# Patient Record
Sex: Male | Born: 1960 | Race: White | Hispanic: No | Smoking: Current every day smoker
Health system: Southern US, Community
[De-identification: ages and names within clinical notes are randomized; demographics above are authoritative.]

## PROBLEM LIST (undated history)

## (undated) DIAGNOSIS — T7840XA Allergy, unspecified, initial encounter: Secondary | ICD-10-CM

## (undated) DIAGNOSIS — F329 Major depressive disorder, single episode, unspecified: Secondary | ICD-10-CM

## (undated) DIAGNOSIS — F419 Anxiety disorder, unspecified: Secondary | ICD-10-CM

## (undated) DIAGNOSIS — G4730 Sleep apnea, unspecified: Secondary | ICD-10-CM

## (undated) DIAGNOSIS — E785 Hyperlipidemia, unspecified: Secondary | ICD-10-CM

## (undated) DIAGNOSIS — F32A Depression, unspecified: Secondary | ICD-10-CM

## (undated) DIAGNOSIS — K219 Gastro-esophageal reflux disease without esophagitis: Secondary | ICD-10-CM

## (undated) DIAGNOSIS — M1990 Unspecified osteoarthritis, unspecified site: Secondary | ICD-10-CM

## (undated) HISTORY — DX: Unspecified osteoarthritis, unspecified site: M19.90

## (undated) HISTORY — DX: Hyperlipidemia, unspecified: E78.5

## (undated) HISTORY — DX: Anxiety disorder, unspecified: F41.9

## (undated) HISTORY — DX: Gastro-esophageal reflux disease without esophagitis: K21.9

## (undated) HISTORY — DX: Major depressive disorder, single episode, unspecified: F32.9

## (undated) HISTORY — PX: TONSILLECTOMY: SUR1361

## (undated) HISTORY — DX: Allergy, unspecified, initial encounter: T78.40XA

## (undated) HISTORY — DX: Depression, unspecified: F32.A

## (undated) HISTORY — DX: Sleep apnea, unspecified: G47.30

---

## 2005-12-09 ENCOUNTER — Emergency Department (HOSPITAL_COMMUNITY): Admission: EM | Admit: 2005-12-09 | Discharge: 2005-12-09 | Payer: Self-pay | Admitting: Emergency Medicine

## 2012-06-18 ENCOUNTER — Ambulatory Visit: Payer: BC Managed Care – PPO

## 2013-03-10 ENCOUNTER — Ambulatory Visit (INDEPENDENT_AMBULATORY_CARE_PROVIDER_SITE_OTHER): Payer: BC Managed Care – PPO | Admitting: Family Medicine

## 2013-03-10 VITALS — BP 120/76 | HR 75 | Temp 98.0°F | Resp 16 | Ht 61.0 in | Wt 210.0 lb

## 2013-03-10 DIAGNOSIS — L0233 Carbuncle of buttock: Secondary | ICD-10-CM

## 2013-03-10 DIAGNOSIS — L0231 Cutaneous abscess of buttock: Secondary | ICD-10-CM

## 2013-03-10 MED ORDER — SULFAMETHOXAZOLE-TRIMETHOPRIM 800-160 MG PO TABS: 2.0000 | ORAL_TABLET | Freq: Two times a day (BID) | ORAL | Status: DC

## 2013-03-10 MED ORDER — HYDROCODONE-ACETAMINOPHEN 5-325 MG PO TABS: 1.0000 | ORAL_TABLET | Freq: Four times a day (QID) | ORAL | Status: DC | PRN

## 2013-03-10 NOTE — Progress Notes (Signed)
Procedure Note: Verbal consent obtained.  Local anesthesia with 2 cc 2% lidocaine.  Betadine prep.  Incision with 11 blade.  No purulence expressed.  Wound irrigated with remaining anesthetic.  Packed with 1/4 inch plain packing.  Cleansed and dressed.  Pt tolerated the procedure well.  Discussed wound care.  Fast track card given to return in 48 hours.

## 2013-03-10 NOTE — Patient Instructions (Addendum)
Incision and Drainage Care After Refer to this sheet in the next few weeks. These instructions provide you with information on caring for yourself after your procedure. Your caregiver may also give you more specific instructions. Your treatment has been planned according to current medical practices, but problems sometimes occur. Call your caregiver if you have any problems or questions after your procedure. HOME CARE INSTRUCTIONS   If antibiotic medicine is given, take it as directed. Finish it even if you start to feel better.  Only take over-the-counter or prescription medicines for pain, discomfort, or fever as directed by your caregiver.  Keep all follow-up appointments as directed by your caregiver.  Change any bandages (dressings) as directed by your caregiver. Replace old dressings with clean dressings.  Wash your hands before and after caring for your wound. You will receive specific instructions for cleansing and caring for your wound.  SEEK MEDICAL CARE IF:   You have increased pain, swelling, or redness around the wound.  You have increased drainage, smell, or bleeding from the wound.  You have muscle aches, chills, or you feel generally sick.  You have a fever. MAKE SURE YOU:   Understand these instructions.  Will watch your condition.  Will get help right away if you are not doing well or get worse. Document Released: 03/04/2012 Document Reviewed: 03/04/2012 [facility] Patient Information 2013 [place], [place].    Incision and Drainage of Abscess An abscess (boil or furuncle) is an area infected by germs that contains a collection of pus. Signs and problems (symptoms) of an abscess include pain, tenderness, redness, or hardness. You may feel a moveable, soft area under your skin. An abscess can occur anywhere in the body. Occasionally, this may spread to surrounding tissues causing cellulitis. Sometimes, a surgeon may make a cut (incision) over your abscess. The pus is  drained. Gauze may be packed into the space to provide a drain. Keeping a drain or piece of gauze in the incision keeps the skin from healing first. This helps stop the abscess from forming again. The area may be painful for 5 to 7 days. Most people with an abscess do not have high fevers. If seen early, your abscess may not have localized and may not be cut. If it does not get better on its own or with medicines, you may require another appointment. HOME CARE INSTRUCTIONS   Only take over-the-counter or prescription medicines for pain, discomfort, or fever as directed by your caregiver. Use these only if your caregiver has not given medicines that would interfere.   See your caregiver as directed for a recheck.   If antibiotics were prescribed, take them as directed.  SEEK MEDICAL CARE IF:   You develop increased pain, swelling, redness, drainage, or bleeding in the wound site.   You develop signs of generalized infection, including muscle aches, chills, or a general ill feeling.   You or your child has an oral temperature above 102 F (38.9 C).  MAKE SURE YOU:   Understand these instructions.   Will watch your condition.   Will get help right away if you are not doing well or get worse.  Document Released: 06/06/2001 Document Revised: 08/23/2011 Document Reviewed: 07/31/2008 [facility] Patient Information 2012 [place], [place].  Abscess An abscess is an infected area that contains a collection of pus and debris.It can occur in almost any part of the body. An abscess is also known as a furuncle or boil. CAUSES  An abscess occurs when tissue gets infected.  This can occur from blockage of oil or sweat glands, infection of hair follicles, or a minor injury to the skin. As the body tries to fight the infection, pus collects in the area and creates pressure under the skin. This pressure causes pain. People with weakened immune systems have difficulty fighting infections and get certain  abscesses more often.  SYMPTOMS Usually an abscess develops on the skin and becomes a painful mass that is red, warm, and tender. If the abscess forms under the skin, you may feel a moveable soft area under the skin. Some abscesses break open (rupture) on their own, but most will continue to get worse without care. The infection can spread deeper into the body and eventually into the bloodstream, causing you to feel ill.  DIAGNOSIS  Your caregiver will take your medical history and perform a physical exam. A sample of fluid may also be taken from the abscess to determine what is causing your infection. TREATMENT  Your caregiver may prescribe antibiotic medicines to fight the infection. However, taking antibiotics alone usually does not cure an abscess. Your caregiver may need to make a small cut (incision) in the abscess to drain the pus. In some cases, gauze is packed into the abscess to reduce pain and to continue draining the area. HOME CARE INSTRUCTIONS   Only take over-the-counter or prescription medicines for pain, discomfort, or fever as directed by your caregiver.  If you were prescribed antibiotics, take them as directed. Finish them even if you start to feel better.  If gauze is used, follow your caregiver's directions for changing the gauze.  To avoid spreading the infection:  Keep your draining abscess covered with a bandage.  Wash your hands well.  Do not share personal care items, towels, or whirlpools with others.  Avoid skin contact with others.  Keep your skin and clothes clean around the abscess.  Keep all follow-up appointments as directed by your caregiver. SEEK MEDICAL CARE IF:   You have increased pain, swelling, redness, fluid drainage, or bleeding.  You have muscle aches, chills, or a general ill feeling.  You have a fever. MAKE SURE YOU:   Understand these instructions.  Will watch your condition.  Will get help right away if you are not doing well or  get worse. Document Released: 09/20/2005 Document Revised: 06/11/2012 Document Reviewed: 02/23/2012 [facility] Patient Information 2013 [place], [place].

## 2013-03-10 NOTE — Progress Notes (Signed)
Subjective:    Patient ID: Ivan Allen, male    DOB: 12-10-1961, 52 y.o.   MRN: [id number] Chief Complaint  Patient presents with  . Cyst    boil on right buttock x 4 days pat has tried to soak in hot water no relief    HPI  Has never had an abscess - first noticed boil 4d ago - now can't even sit.  All weekend he has been putting hot compresses. He noticed a little bump on his behind and then the next day became painful. 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  Past Medical History  Diagnosis Date  . Depression   . Anxiety    No current outpatient prescriptions on file prior to visit.   No current facility-administered medications on file prior to visit.   No Known Allergies  Review of Systems  Constitutional: Negative for fever, chills, activity change and appetite change.  Cardiovascular: Negative for leg swelling.  Gastrointestinal: Negative for nausea, vomiting, abdominal pain, diarrhea and constipation.  Musculoskeletal: Negative for myalgias, joint swelling and gait problem.  Skin: Positive for color change, rash and wound.  Neurological: Negative for weakness and numbness.  Hematological: Negative for adenopathy. Does not bruise/bleed easily.  Psychiatric/Behavioral: Negative for sleep disturbance.      BP 120/76  Pulse 75  Temp(Src) 98 F (36.7 C) (Oral)  Resp 16  Ht 5\' 1"  (1.549 m)  Wt 210 lb (95.255 kg)  BMI 39.7 kg/m2  SpO2 96% Objective:   Physical Exam  Constitutional: He is oriented to person, place, and time. He appears well-developed and well-nourished. No distress.  HENT:  Head: Normocephalic and atraumatic.  Eyes: No scleral icterus.  Pulmonary/Chest: Effort normal.  Neurological: He is alert and oriented to person, place, and time.  Skin: Skin is warm and dry. Lesion and rash noted. He is  not diaphoretic.  On central right buttock near gluteal crease is very large purulent 1 cm nodule with surrounding blanchable erythematous warm tender macule of 3 cm radius, very indurated, with tendrils of induration extending for several cm proximal and distal.  Psychiatric: He has a normal mood and affect. His behavior is normal.          Assessment & Plan:  Abscess of buttock, right - Plan: HYDROcodone-acetaminophen (NORCO/VICODIN) 5-325 MG per tablet, Wound culture, sulfamethoxazole-trimethoprim (BACTRIM DS,SEPTRA DS) 800-160 MG per tablet.  S/p I&D with packing with minimal purulence  Meds ordered this encounter  Medications  . lamoTRIgine (LAMICTAL) 200 MG tablet    Sig: Take 200 mg by mouth daily.  Marland Kitchen escitalopram (LEXAPRO) 10 MG tablet    Sig: Take 10 mg by mouth daily.                 Marland Kitchen HYDROcodone-acetaminophen (NORCO/VICODIN) 5-325 MG per tablet    Sig: Take 1 tablet by mouth every 6 (six) hours as needed for pain.    Dispense:  15 tablet    Refill:  0  . sulfamethoxazole-trimethoprim (BACTRIM DS,SEPTRA DS) 800-160 MG per tablet    Sig: Take 2 tablets by mouth 2 (two) times daily.    Dispense:  40 tablet    Refill:  0    Order Specific Question:  Supervising Provider    Answer:  Ethelda Chick [[id number]]

## 2013-03-12 ENCOUNTER — Ambulatory Visit (INDEPENDENT_AMBULATORY_CARE_PROVIDER_SITE_OTHER): Payer: BC Managed Care – PPO | Admitting: Physician Assistant

## 2013-03-12 VITALS — BP 112/94 | HR 78 | Temp 98.5°F | Resp 16 | Ht 61.0 in | Wt 211.0 lb

## 2013-03-12 DIAGNOSIS — L0231 Cutaneous abscess of buttock: Secondary | ICD-10-CM

## 2013-03-12 LAB — POCT CBC
Granulocyte percent: 56.8 %G (ref 37–80)
HCT, POC: 45.1 % (ref 43.5–53.7)
Hemoglobin: 14.7 g/dL (ref 14.1–18.1)
Lymph, poc: 3.3 (ref 0.6–3.4)
MCH, POC: 32 pg — AB (ref 27–31.2)
MCHC: 32.6 g/dL (ref 31.8–35.4)
MCV: 98.1 fL — AB (ref 80–97)
MID (cbc): 0.9 (ref 0–0.9)
MPV: 9.4 fL (ref 0–99.8)
POC Granulocyte: 5.5 (ref 2–6.9)
POC LYMPH PERCENT: 34.2 %L (ref 10–50)
POC MID %: 9 %M (ref 0–12)
Platelet Count, POC: 236 10*3/uL (ref 142–424)
RBC: 4.6 M/uL — AB (ref 4.69–6.13)
RDW, POC: 12.9 %
WBC: 9.7 10*3/uL (ref 4.6–10.2)

## 2013-03-12 NOTE — Progress Notes (Signed)
Patient ID: Ivan Allen MRN: [id number], DOB: 04/21/1961 52 y.o. Date of Encounter: 03/12/2013, 5:41 PM  Chief Complaint: Wound care   See previous note  HPI: 52 y.o. y/o male presents for wound care s/p I&D on 03/10/13. Doing well No issues or complaints Afebrile/ no chills No nausea or vomiting Tolerating Bactrim Pain improved but area still tender to sit on. Also admits to skin irritation secondary to tape.  Daily dressing change Previous note reviewed. Per last procedure note, no purulence expressed upon I&D.    Past Medical History  Diagnosis Date  . Depression   . Anxiety      Home Meds: Prior to Admission medications   Medication Sig Start Date End Date Taking? Authorizing Provider  escitalopram (LEXAPRO) 10 MG tablet Take 10 mg by mouth daily.   Yes Historical Provider, MD  HYDROcodone-acetaminophen (NORCO/VICODIN) 5-325 MG per tablet Take 1 tablet by mouth every 6 (six) hours as needed for pain. 03/10/13  Yes Sherren Mocha, MD  lamoTRIgine (LAMICTAL) 200 MG tablet Take 200 mg by mouth daily.   Yes Historical Provider, MD  sulfamethoxazole-trimethoprim (BACTRIM DS,SEPTRA DS) 800-160 MG per tablet Take 2 tablets by mouth 2 (two) times daily. 03/10/13  Yes Godfrey Pick, PA-C    Allergies: No Known Allergies  ROS: Constitutional: Afebrile, no chills Cardiovascular: negative for chest pain or palpitations Dermatological: Positive for wound, erythema, and tenderness.  GI: No nausea or vomiting   EXAM: Physical Exam: Blood pressure 112/94, pulse 78, temperature 98.5 F (36.9 C), temperature source Oral, resp. rate 16, height 5\' 1"  (1.549 m), weight 211 lb (95.709 kg), SpO2 98.00%., Body mass index is 39.89 kg/(m^2). General: Well developed, well nourished, in no acute distress. Nontoxic appearing. Head: Normocephalic, atraumatic, sclera non-icteric.  Neck: Supple. Lungs: Breathing is unlabored. Heart: Normal rate. Skin:  Warm and moist. Dressing and packing in place.  Positive induration, erythema, and tenderness to palpation.  No purulence expressed from wound. There is a second indurated area superior to original incision site. No fluctuance or drainage.  Neuro: Alert and oriented X 3. Moves all extremities spontaneously. Normal gait.  Psych:  Responds to questions appropriately with a normal affect.       PROCEDURE: Dressing removed. Packing not in place.  No purulence expressed Wound bed has white eschar that I attempted to debride.  Irrigated with 2% plain lidocaine 5 cc. Not repacked.  Dressing applied  LAB: Culture: Staph Aureus.   Results for orders placed in visit on 03/12/13  POCT CBC      Result Value Range   WBC 9.7  4.6 - 10.2 K/uL   Lymph, poc 3.3  0.6 - 3.4   POC LYMPH PERCENT 34.2  10 - 50 %L   MID (cbc) 0.9  0 - 0.9   POC MID % 9.0  0 - 12 %M   POC Granulocyte 5.5  2 - 6.9   Granulocyte percent 56.8  37 - 80 %G   RBC 4.60 (*) 4.69 - 6.13 M/uL   Hemoglobin 14.7  14.1 - 18.1 g/dL   HCT, POC 40.9  81.1 - 53.7 %   MCV 98.1 (*) 80 - 97 fL   MCH, POC 32.0 (*) 27 - 31.2 pg   MCHC 32.6  31.8 - 35.4 g/dL   RDW, POC 91.4     Platelet Count, POC 236  142 - 424 K/uL   MPV 9.4  0 - 99.8 fL     A/P: 52 y.o. y/o  male with buttocks cellulitis/abscess as above s/p I&D on 03/10/13. Wound care per above Continue Bactrim Pain well controlled.  Recommend warm compresses and sitz bath tonigh Change dressing tomorrow. Recheck 24 hours If still indurated and no area of fluctuance palpated, consider referral to General Surgery for I&D  Signed, Rhoderick Moody, PA-C 03/12/2013 5:41 PM

## 2013-03-13 ENCOUNTER — Ambulatory Visit (INDEPENDENT_AMBULATORY_CARE_PROVIDER_SITE_OTHER): Payer: BC Managed Care – PPO | Admitting: Emergency Medicine

## 2013-03-13 VITALS — BP 150/88 | HR 89 | Temp 98.5°F | Resp 16 | Ht 61.0 in | Wt 211.0 lb

## 2013-03-13 DIAGNOSIS — L03317 Cellulitis of buttock: Secondary | ICD-10-CM

## 2013-03-13 DIAGNOSIS — L0231 Cutaneous abscess of buttock: Secondary | ICD-10-CM

## 2013-03-13 LAB — POCT CBC
Granulocyte percent: 69.1 %G (ref 37–80)
HCT, POC: 48.4 % (ref 43.5–53.7)
Hemoglobin: 16 g/dL (ref 14.1–18.1)
Lymph, poc: 2.2 (ref 0.6–3.4)
POC Granulocyte: 6.6 (ref 2–6.9)

## 2013-03-13 LAB — WOUND CULTURE

## 2013-03-13 NOTE — Progress Notes (Signed)
   Patient ID: Robbert Langlinais MRN: [id number], DOB: [birth year], 52 y.o. Date of Encounter: 03/13/2013, 12:39 PM    PROCEDURE NOTE: Verbal consent obtained. Risks and benefits were explained to the patient.  Patient made an informed decision to proceed with the procedure.  Superficial necrosis debrided with pickups. Upon lifting this tissue I was able to visualize further thickened purulence some of which was able to be easily removed with pickups, however the patient was not tolerating this well so we had to again add some local anesthesia. We then were able to complete the debridement. Exploration of the lesion does not reveal any fistulas. Plan for repack and recheck in 24 hours.    SignedEula Listen, PA-C 03/13/2013 12:39 PM

## 2013-03-13 NOTE — Progress Notes (Signed)
Subjective:    Patient ID: Ivan Allen, male    DOB: 1961/06/14, 52 y.o.   MRN: [id number]  HPI   Ivan Allen is a very pleasant 52 yr old male here for wound check.  He had an abscess that was I&D'd here 03/10/13.  First follow-up was yesterday 03/12/13.  At that time, the packing was not in place and the wound was covered in eschar/necrotic tissue.  It was debrided and not repacked.  There was also another area developing superior to the initial abscess that was not yet fluctuant.  Pt did warm compresses last night and felt that the superior area drained spontaneously.  Still quite sore.  Tolerating the antibiotics well.  No nausea or vomiting.  No fevers or chills, but does states that for about the past week he has night sweats every night.      Review of Systems  Constitutional: Negative for fever and chills.  Gastrointestinal: Negative for nausea and vomiting.  Skin: Positive for wound.  All other systems reviewed and are negative.       Objective:   Physical Exam  Vitals reviewed. Constitutional: He is oriented to person, place, and time. He appears well-developed and well-nourished. No distress.  HENT:  Head: Normocephalic and atraumatic.  Eyes: Conjunctivae are normal. No scleral icterus.  Pulmonary/Chest: Effort normal.  Neurological: He is alert and oriented to person, place, and time.  Skin: Skin is warm and dry.     Psychiatric: He has a normal mood and affect. His behavior is normal.     Filed Vitals:   03/13/13 1044  BP: 150/88  Pulse: 89  Temp: 98.5 F (36.9 C)  Resp: 16     Results for orders placed in visit on 03/13/13  POCT CBC      Result Value Range   WBC 9.6  4.6 - 10.2 K/uL   Lymph, poc 2.2  0.6 - 3.4   POC LYMPH PERCENT 22.9  10 - 50 %L   MID (cbc) 0.8  0 - 0.9   POC MID % 8.0  0 - 12 %M   POC Granulocyte 6.6  2 - 6.9   Granulocyte percent 69.1  37 - 80 %G   RBC 4.97  4.69 - 6.13 M/uL   Hemoglobin 16.0  14.1 - 18.1 g/dL   HCT, POC 40.9   81.1 - 53.7 %   MCV 97.4 (*) 80 - 97 fL   MCH, POC 32.2 (*) 27 - 31.2 pg   MCHC 33.1  31.8 - 35.4 g/dL   RDW, POC 91.4     Platelet Count, POC 284  142 - 424 K/uL   MPV 9.1  0 - 99.8 fL     Procedure Note: Verbal consent obtained from the patient.  Local anesthesia with 2cc plain lidocaine. Betadine prep.  Incision with 11 blade.  Very small amount of purulence expressed with deep palpation.  Very uncomfortable for pt.  Packed with a small amount of 1/4" plain packing.  Cleansed and dressed.  See additional procedure note by Eula Listen PA-C    Assessment & Plan:  Cellulitis and abscess of buttock - Plan: POCT CBC  Ivan Allen is a very pleasant 52 yr old male here with two abscess of the right buttock.  One initially drained 03/10/13 - today sharply debrided as it was covered in necrotic tissue.  There is a second abscess located superior to the initial abscess.  I attempted to drain this today, but very  minimal purulence was expressed.  The abscesses do not appear to be communicating.  The more inferior abscess does not appear to be perirectal.  Culture grew MRSA that is susceptible to MRSA.  White count stable from yesterday.  Continue Bactrim.  Warm compresses tonight.  Pt will follow-up tomorrow with Alycia Rossetti.

## 2013-03-14 ENCOUNTER — Ambulatory Visit (INDEPENDENT_AMBULATORY_CARE_PROVIDER_SITE_OTHER): Payer: BC Managed Care – PPO | Admitting: Physician Assistant

## 2013-03-14 VITALS — BP 110/70 | HR 86 | Temp 98.5°F | Resp 16 | Ht 61.0 in | Wt 211.0 lb

## 2013-03-14 DIAGNOSIS — L0231 Cutaneous abscess of buttock: Secondary | ICD-10-CM

## 2013-03-14 DIAGNOSIS — IMO0001 Reserved for inherently not codable concepts without codable children: Secondary | ICD-10-CM

## 2013-03-14 DIAGNOSIS — M7918 Myalgia, other site: Secondary | ICD-10-CM

## 2013-03-14 NOTE — Progress Notes (Signed)
   Patient ID: Ivan Allen MRN: [id number], DOB: [birth year] 52 y.o. Date of Encounter: 03/14/2013, 12:15 PM  Primary Physician: No primary provider on file.  Chief Complaint: Wound care   See previous notes  HPI: 52 y.o. male presents for wound care s/p I&D on 03/10/13. Feeling much better. Afebrile. No chills. Over the past 24 hours hours he has been using warm compresses. Able to sit without much discomfort at all now. "Feels like a scrap now." Much improved night sweats. No nausea or vomiting. Patient states that he is very pleased with his improvement over the past 24 hours.   Previous notes reviewed.  Of note, sadly today is the one year anniversary of his wife's passing of AML. He is in good spirits. He will spend the rest of today with his son, when he gets home from work, and his daughter, who is off from work today. No depression, SI or HI. He was thankful for [facility] asking about the above.   Past Medical History  Diagnosis Date  . Depression   . Anxiety      Home Meds: Prior to Admission medications   Medication Sig Start Date End Date Taking? Authorizing Provider  escitalopram (LEXAPRO) 10 MG tablet Take 10 mg by mouth daily.   Yes Historical Provider, MD  HYDROcodone-acetaminophen (NORCO/VICODIN) 5-325 MG per tablet Take 1 tablet by mouth every 6 (six) hours as needed for pain. 03/10/13  Yes Sherren Mocha, MD  lamoTRIgine (LAMICTAL) 200 MG tablet Take 200 mg by mouth daily.   Yes Historical Provider, MD  sulfamethoxazole-trimethoprim (BACTRIM DS,SEPTRA DS) 800-160 MG per tablet Take 2 tablets by mouth 2 (two) times daily. 03/10/13  Yes Godfrey Pick, PA-C    Allergies: No Known Allergies  ROS: Constitutional: Afebrile, no chills Cardiovascular: negative for chest pain or palpitations Dermatological: Positive for wound, erythema, and improved pain. Negative for warmth  GI: No nausea or vomiting   EXAM: Physical Exam: Blood pressure 110/70, pulse 86, temperature 98.5 F  (36.9 C), temperature source Oral, resp. rate 16, height 5\' 1"  (1.549 m), weight 211 lb (95.709 kg), SpO2 98.00%., Body mass index is 39.89 kg/(m^2). General: Well developed, well nourished, in no acute distress. Nontoxic appearing. Head: Normocephalic, atraumatic, sclera non-icteric.  Neck: Supple. Lungs: Breathing is unlabored. Heart: Normal rate. Skin:  Warm and moist. Dressing and packing in place along the superior lesion. Much improved induration, erythema, and tenderness to palpation. I cannot elicit TTP along either lesion, this is much improved from visit on 03/13/13.    Neuro: Alert and oriented X 3. Moves all extremities spontaneously. Normal gait.  Psych:  Responds to questions appropriately with a normal affect.   PROCEDURE: Dressing and packing removed. No purulence expressed form either lesion Wound beds healthy Lesions debrided Irrigated with 1% plain lidocaine 5 cc. Inferior lesion repacked with 1/4 inch plain packing Dressing applied Patient tolerated the above procedure much better today, not requiring any local anesthesia  LAB: Culture: MRSA, sensitive to Bactrim  A/P: 52 y.o. male with much improved cellulitis/abscess as above s/p I&D on 03/10/13 -Greatly improved -Wound care per above -Continue Bactrim -Pain greatly improved -Daily dressing changes -Recheck 48 hours, sooner if needed  Signed, Eula Listen, PA-C 03/14/2013 12:15 PM

## 2013-03-16 ENCOUNTER — Ambulatory Visit (INDEPENDENT_AMBULATORY_CARE_PROVIDER_SITE_OTHER): Payer: BC Managed Care – PPO | Admitting: Physician Assistant

## 2013-03-16 VITALS — BP 138/89 | HR 85 | Temp 98.4°F | Resp 18 | Ht 71.5 in | Wt 211.0 lb

## 2013-03-16 DIAGNOSIS — L03317 Cellulitis of buttock: Secondary | ICD-10-CM

## 2013-03-16 DIAGNOSIS — L0231 Cutaneous abscess of buttock: Secondary | ICD-10-CM

## 2013-03-16 NOTE — Progress Notes (Signed)
  Subjective:    Patient ID: Ivan Allen, male    DOB: January 25, 1961, 52 y.o.   MRN: [id number]  HPI  Ivan Allen is a very pleasant 52 yr old male here for follow up on abscess drained here 03/10/13.  See previous notes for details of wound care.  Today feeling much better.  No pain, just some irritation and itching which he attributes to healing.  Tolerating the abx well.  No fevers or chills. Minimal drainage from the wounds at this point.  Pt very appreciative of the care he has received at [facility].    Review of Systems  Skin: Positive for wound.  All other systems reviewed and are negative.       Objective:   Physical Exam  Vitals reviewed. Constitutional: He is oriented to person, place, and time. He appears well-developed and well-nourished. No distress.  HENT:  Head: Normocephalic and atraumatic.  Eyes: Conjunctivae are normal. No scleral icterus.  Pulmonary/Chest: Effort normal.  Neurological: He is alert and oriented to person, place, and time.  Skin: Skin is warm and dry.     Psychiatric: He has a normal mood and affect. His behavior is normal.     Wound Care: Dressing removed.  No packing.  Debrided a small amount of necrotic tissue from the inferior wound.  Wound bed visualized, beefy red and healthy.  Superior lesion with a small amount of overlying necrotic tissue as well, debrided this.  Irrigated superior wound with 1cc 1% plain lidocaine.  I did not repack the wounds.  Dressing applied.       Assessment & Plan:  Cellulitis and abscess of buttock  Ivan Allen is a very pleasant 52 yr old male here for recheck of wounds.  Wounds look much improved from when I last saw them on 03/13/13.  Both areas are now very shallow and do no require packing.  He still has some erythema and induration surrounding the superior wound, but I was unable to express anything from this area.  I have redressed the wounds.  Continue daily dressing changes.  Continue TMP/SMX.  Pt likely does not  need further wound care, but will have him recheck in 48 hours anyway just to be sure, given his course

## 2013-03-18 ENCOUNTER — Ambulatory Visit (INDEPENDENT_AMBULATORY_CARE_PROVIDER_SITE_OTHER): Payer: BC Managed Care – PPO | Admitting: Physician Assistant

## 2013-03-18 VITALS — BP 100/60 | HR 86 | Temp 98.5°F | Resp 16 | Ht 71.0 in | Wt 208.0 lb

## 2013-03-18 DIAGNOSIS — L0231 Cutaneous abscess of buttock: Secondary | ICD-10-CM

## 2013-03-18 DIAGNOSIS — L03317 Cellulitis of buttock: Secondary | ICD-10-CM

## 2013-03-18 NOTE — Progress Notes (Signed)
   Patient ID: Ivan Allen MRN: [id number], DOB: [birth year] 52 y.o. Date of Encounter: 03/18/2013, 4:57 PM  Primary Physician: No primary provider on file.  Chief Complaint: Wound care   See previous note  HPI: 52 y.o. male presents for wound care s/p I&D on 03/10/13 Doing well No issues or complaints Afebrile/ no chills No nausea or vomiting Tolerating Bactrim Pain resolved Daily dressing change Previous note reviewed  Past Medical History  Diagnosis Date  . Depression   . Anxiety      Home Meds: Prior to Admission medications   Medication Sig Start Date End Date Taking? Authorizing Provider  escitalopram (LEXAPRO) 10 MG tablet Take 10 mg by mouth daily.   Yes Historical Provider, MD  lamoTRIgine (LAMICTAL) 200 MG tablet Take 200 mg by mouth daily.   Yes Historical Provider, MD  sulfamethoxazole-trimethoprim (BACTRIM DS,SEPTRA DS) 800-160 MG per tablet Take 2 tablets by mouth 2 (two) times daily. 03/10/13  Yes Godfrey Pick, PA-C  HYDROcodone-acetaminophen (NORCO/VICODIN) 5-325 MG per tablet Take 1 tablet by mouth every 6 (six) hours as needed for pain. 03/10/13  No Sherren Mocha, MD    Allergies: No Known Allergies  ROS: Constitutional: Afebrile, no chills Cardiovascular: negative for chest pain or palpitations Dermatological: Positive for wound. Negative for erythema, pain, or warmth  GI: No nausea or vomiting   EXAM: Physical Exam: Blood pressure 100/60, pulse 86, temperature 98.5 F (36.9 C), temperature source Oral, resp. rate 16, height 5\' 11"  (1.803 m), weight 208 lb (94.348 kg)., Body mass index is 29.02 kg/(m^2). General: Well developed, well nourished, in no acute distress. Nontoxic appearing. Head: Normocephalic, atraumatic, sclera non-icteric.  Neck: Supple. Lungs: Breathing is unlabored. Heart: Normal rate. Skin:  Warm and moist. No induration or tenderness to palpation. Mostly resolved erythema. No further necrotic tissue.  Neuro: Alert and oriented X  3. Moves all extremities spontaneously. Normal gait.  Psych:  Responds to questions appropriately with a normal affect.    LAB: Culture: MRSA  A/P: 52 y.o. y/o male with resolved cellulitis/abscess as above s/p I&D on 03/10/13 -Wound care per above -Finish Bactrim -Pain resolved -Follow up prn  Signed, Eula Listen, PA-C 03/18/2013 4:57 PM

## 2014-03-29 ENCOUNTER — Ambulatory Visit (INDEPENDENT_AMBULATORY_CARE_PROVIDER_SITE_OTHER): Payer: BC Managed Care – PPO | Admitting: Emergency Medicine

## 2014-03-29 VITALS — BP 122/80 | HR 92 | Temp 98.2°F | Resp 18 | Ht 71.0 in | Wt 215.0 lb

## 2014-03-29 DIAGNOSIS — L0231 Cutaneous abscess of buttock: Secondary | ICD-10-CM

## 2014-03-29 DIAGNOSIS — L03317 Cellulitis of buttock: Secondary | ICD-10-CM

## 2014-03-29 MED ORDER — SULFAMETHOXAZOLE-TRIMETHOPRIM 800-160 MG PO TABS: 2.0000 | ORAL_TABLET | Freq: Two times a day (BID) | ORAL | Status: DC

## 2014-03-29 NOTE — Patient Instructions (Signed)
Abscess Abscess An abscess is an infected area that contains a collection of pus and debris.It can occur in almost any part of the body. An abscess is also known as a furuncle or boil. CAUSES  An abscess occurs when tissue gets infected. This can occur from blockage of oil or sweat glands, infection of hair follicles, or a minor injury to the skin. As the body tries to fight the infection, pus collects in the area and creates pressure under the skin. This pressure causes pain. People with weakened immune systems have difficulty fighting infections and get certain abscesses more often.  SYMPTOMS Usually an abscess develops on the skin and becomes a painful mass that is red, warm, and tender. If the abscess forms under the skin, you may feel a moveable soft area under the skin. Some abscesses break open (rupture) on their own, but most will continue to get worse without care. The infection can spread deeper into the body and eventually into the bloodstream, causing you to feel ill.  DIAGNOSIS  Your caregiver will take your medical history and perform a physical exam. A sample of fluid may also be taken from the abscess to determine what is causing your infection. TREATMENT  Your caregiver may prescribe antibiotic medicines to fight the infection. However, taking antibiotics alone usually does not cure an abscess. Your caregiver may need to make a small cut (incision) in the abscess to drain the pus. In some cases, gauze is packed into the abscess to reduce pain and to continue draining the area. HOME CARE INSTRUCTIONS   Only take over-the-counter or prescription medicines for pain, discomfort, or fever as directed by your caregiver.  If you were prescribed antibiotics, take them as directed. Finish them even if you start to feel better.  If gauze is used, follow your caregiver's directions for changing the gauze.  To avoid spreading the infection:  Keep your draining abscess covered with a  bandage.  Wash your hands well.  Do not share personal care items, towels, or whirlpools with others.  Avoid skin contact with others.  Keep your skin and clothes clean around the abscess.  Keep all follow-up appointments as directed by your caregiver. SEEK MEDICAL CARE IF:   You have increased pain, swelling, redness, fluid drainage, or bleeding.  You have muscle aches, chills, or a general ill feeling.  You have a fever. MAKE SURE YOU:   Understand these instructions.  Will watch your condition.  Will get help right away if you are not doing well or get worse. Document Released: 09/20/2005 Document Revised: 06/11/2012 Document Reviewed: 02/23/2012 [facility] Patient Information 2014 [place].

## 2014-03-29 NOTE — Progress Notes (Signed)
[facility] [address] [place] [address] [phone]  Date:  03/29/2014   Name:  Ivan Allen   DOB:  [birth year]   MRN:  [id number]  PCP:  No PCP Per Patient    Chief Complaint: Cellulitis   History of Present Illness:  Ivan Allen is a 53 y.o. very pleasant male patient who presents with the following:  Has a history of MRSA of buttock.  Now has a new lesion that is tender and red.  No fever or chills.  No drainage.  No improvement with over the counter medications or other home remedies. Denies other complaint or health concern today..  There are no active problems to display for this patient.   Past Medical History  Diagnosis Date  . Depression   . Anxiety     History reviewed. No pertinent past surgical history.  History  Substance Use Topics  . Smoking status: Current Every Day Smoker  . Smokeless tobacco: Not on file  . Alcohol Use: Not on file    History reviewed. No pertinent family history.  No Known Allergies  Medication list has been reviewed and updated.  Current Outpatient Prescriptions on File Prior to Visit  Medication Sig Dispense Refill  . escitalopram (LEXAPRO) 10 MG tablet Take 10 mg by mouth daily.      Marland Kitchen lamoTRIgine (LAMICTAL) 200 MG tablet Take 200 mg by mouth daily.      Marland Kitchen HYDROcodone-acetaminophen (NORCO/VICODIN) 5-325 MG per tablet Take 1 tablet by mouth every 6 (six) hours as needed for pain.  15 tablet  0  . sulfamethoxazole-trimethoprim (BACTRIM DS,SEPTRA DS) 800-160 MG per tablet Take 2 tablets by mouth 2 (two) times daily.  40 tablet  0   No current facility-administered medications on file prior to visit.    Review of Systems:  As per HPI, otherwise negative.    Physical Examination: Filed Vitals:   03/29/14 1557  BP: 122/80  Pulse: 92  Temp: 98.2 F (36.8 C)  Resp: 18   Filed Vitals:   03/29/14 1557  Height: 5\' 11"  (1.803 m)  Weight: 215 lb (97.523 kg)   Body mass index is 30  kg/(m^2). Ideal Body Weight: Weight in (lb) to have BMI = 25: 178.9   GEN: WDWN, NAD, Non-toxic, Alert & Oriented x 3 HEENT: Atraumatic, Normocephalic.  Ears and Nose: No external deformity. EXTR: No clubbing/cyanosis/edema NEURO: Normal gait.  PSYCH: Normally interactive. Conversant. Not depressed or anxious appearing.  Calm demeanor.  LEFT buttock:  Erythematous lesion on left buttock.  No drainage.  Soft.  Assessment and Plan: Abscess buttock Septra ds SITZ   Signed,  Ellison Carwin, MD

## 2014-05-04 ENCOUNTER — Encounter (HOSPITAL_COMMUNITY): Payer: Self-pay | Admitting: Emergency Medicine

## 2014-05-04 ENCOUNTER — Observation Stay (HOSPITAL_COMMUNITY): Payer: Worker's Compensation

## 2014-05-04 ENCOUNTER — Observation Stay (HOSPITAL_COMMUNITY)
Admission: EM | Admit: 2014-05-04 | Discharge: 2014-05-05 | Disposition: A | Discharge status: Home / Self Care | Payer: Worker's Compensation | Attending: Family Medicine | Admitting: Family Medicine

## 2014-05-04 ENCOUNTER — Emergency Department (HOSPITAL_COMMUNITY): Payer: Worker's Compensation

## 2014-05-04 DIAGNOSIS — Z79899 Other long term (current) drug therapy: Secondary | ICD-10-CM | POA: Diagnosis not present

## 2014-05-04 DIAGNOSIS — R29898 Other symptoms and signs involving the musculoskeletal system: Secondary | ICD-10-CM | POA: Diagnosis not present

## 2014-05-04 DIAGNOSIS — R110 Nausea: Secondary | ICD-10-CM | POA: Insufficient documentation

## 2014-05-04 DIAGNOSIS — R55 Syncope and collapse: Principal | ICD-10-CM | POA: Diagnosis present

## 2014-05-04 DIAGNOSIS — F411 Generalized anxiety disorder: Secondary | ICD-10-CM | POA: Insufficient documentation

## 2014-05-04 DIAGNOSIS — I10 Essential (primary) hypertension: Secondary | ICD-10-CM | POA: Diagnosis present

## 2014-05-04 DIAGNOSIS — R29810 Facial weakness: Secondary | ICD-10-CM | POA: Diagnosis not present

## 2014-05-04 DIAGNOSIS — F319 Bipolar disorder, unspecified: Secondary | ICD-10-CM | POA: Diagnosis not present

## 2014-05-04 DIAGNOSIS — F17200 Nicotine dependence, unspecified, uncomplicated: Secondary | ICD-10-CM | POA: Diagnosis present

## 2014-05-04 DIAGNOSIS — N189 Chronic kidney disease, unspecified: Secondary | ICD-10-CM | POA: Diagnosis present

## 2014-05-04 DIAGNOSIS — T43625A Adverse effect of amphetamines, initial encounter: Secondary | ICD-10-CM | POA: Diagnosis present

## 2014-05-04 LAB — CBC
HCT: 43.1 % (ref 39.0–52.0)
Hemoglobin: 14.8 g/dL (ref 13.0–17.0)
MCH: 32.2 pg (ref 26.0–34.0)
MCHC: 34.3 g/dL (ref 30.0–36.0)
MCV: 93.7 fL (ref 78.0–100.0)
PLATELETS: 203 10*3/uL (ref 150–400)
RBC: 4.6 MIL/uL (ref 4.22–5.81)
RDW: 12.9 % (ref 11.5–15.5)
WBC: 7 10*3/uL (ref 4.0–10.5)

## 2014-05-04 LAB — RAPID URINE DRUG SCREEN, HOSP PERFORMED
Amphetamines: POSITIVE — AB
Amphetamines: NOT DETECTED
BARBITURATES: NOT DETECTED
Barbiturates: NOT DETECTED
Benzodiazepines: NOT DETECTED
Benzodiazepines: NOT DETECTED
Cocaine: NOT DETECTED
Cocaine: NOT DETECTED
OPIATES: NOT DETECTED
Opiates: NOT DETECTED
Tetrahydrocannabinol: NOT DETECTED
Tetrahydrocannabinol: NOT DETECTED

## 2014-05-04 LAB — COMPREHENSIVE METABOLIC PANEL
ALT: 19 U/L (ref 0–53)
AST: 20 U/L (ref 0–37)
Albumin: 4.3 g/dL (ref 3.5–5.2)
Alkaline Phosphatase: 73 U/L (ref 39–117)
BUN: 19 mg/dL (ref 6–23)
CO2: 24 meq/L (ref 19–32)
Calcium: 9.5 mg/dL (ref 8.4–10.5)
Chloride: 106 mEq/L (ref 96–112)
Creatinine, Ser: 1.02 mg/dL (ref 0.50–1.35)
GFR calc non Af Amer: 82 mL/min — ABNORMAL LOW (ref >90)
GLUCOSE: 85 mg/dL (ref 70–99)
POTASSIUM: 4.3 meq/L (ref 3.7–5.3)
SODIUM: 142 meq/L (ref 137–147)
TOTAL PROTEIN: 7 g/dL (ref 6.0–8.3)
Total Bilirubin: 0.3 mg/dL (ref 0.3–1.2)

## 2014-05-04 LAB — PROTIME-INR
INR: 0.96 (ref 0.00–1.49)
Prothrombin Time: 12.6 seconds (ref 11.6–15.2)

## 2014-05-04 LAB — URINALYSIS, ROUTINE W REFLEX MICROSCOPIC
Bilirubin Urine: NEGATIVE
GLUCOSE, UA: NEGATIVE mg/dL
Hgb urine dipstick: NEGATIVE
Ketones, ur: NEGATIVE mg/dL
LEUKOCYTES UA: NEGATIVE
NITRITE: NEGATIVE
Protein, ur: NEGATIVE mg/dL
Specific Gravity, Urine: 1.024 (ref 1.005–1.030)
Urobilinogen, UA: 0.2 mg/dL (ref 0.0–1.0)
pH: 6.5 (ref 5.0–8.0)

## 2014-05-04 LAB — APTT: APTT: 27 s (ref 24–37)

## 2014-05-04 LAB — DIFFERENTIAL
Basophils Absolute: 0 10*3/uL (ref 0.0–0.1)
Basophils Relative: 0 % (ref 0–1)
EOS ABS: 0 10*3/uL (ref 0.0–0.7)
Eosinophils Relative: 1 % (ref 0–5)
LYMPHS ABS: 2.3 10*3/uL (ref 0.7–4.0)
LYMPHS PCT: 33 % (ref 12–46)
Monocytes Absolute: 0.6 10*3/uL (ref 0.1–1.0)
Monocytes Relative: 9 % (ref 3–12)
NEUTROS PCT: 57 % (ref 43–77)
Neutro Abs: 4.1 10*3/uL (ref 1.7–7.7)

## 2014-05-04 LAB — I-STAT CHEM 8, ED
BUN: 19 mg/dL (ref 6–23)
CHLORIDE: 107 meq/L (ref 96–112)
CREATININE: 1.1 mg/dL (ref 0.50–1.35)
Calcium, Ion: 1.19 mmol/L (ref 1.12–1.23)
GLUCOSE: 84 mg/dL (ref 70–99)
HCT: 44 % (ref 39.0–52.0)
Hemoglobin: 15 g/dL (ref 13.0–17.0)
Potassium: 4.2 mEq/L (ref 3.7–5.3)
Sodium: 142 mEq/L (ref 137–147)
TCO2: 26 mmol/L (ref 0–100)

## 2014-05-04 LAB — I-STAT TROPONIN, ED: Troponin i, poc: 0 ng/mL (ref 0.00–0.08)

## 2014-05-04 LAB — ETHANOL: Alcohol, Ethyl (B): <11 mg/dL (ref 0–11)

## 2014-05-04 MED ORDER — ASPIRIN 325 MG PO TABS
325.0000 mg | ORAL_TABLET | Freq: Every day | ORAL | Status: DC
  Administered 2014-05-04 – 2014-05-05 (×2): 325 mg via ORAL
  Filled 2014-05-04 (×2): qty 1

## 2014-05-04 MED ORDER — LAMOTRIGINE 200 MG PO TABS
200.0000 mg | ORAL_TABLET | Freq: Two times a day (BID) | ORAL | Status: DC
  Administered 2014-05-04 – 2014-05-05 (×2): 200 mg via ORAL
  Filled 2014-05-04 (×3): qty 1

## 2014-05-04 MED ORDER — STROKE: EARLY STAGES OF RECOVERY BOOK
Freq: Once | Status: AC
  Administered 2014-05-04: 20:00:00
  Filled 2014-05-04: qty 1

## 2014-05-04 MED ORDER — ESCITALOPRAM OXALATE 20 MG PO TABS
20.0000 mg | ORAL_TABLET | Freq: Every day | ORAL | Status: DC
  Administered 2014-05-04: 20 mg via ORAL
  Filled 2014-05-04 (×2): qty 1

## 2014-05-04 MED ORDER — HEPARIN SODIUM (PORCINE) 5000 UNIT/ML IJ SOLN
5000.0000 [IU] | Freq: Three times a day (TID) | INTRAMUSCULAR | Status: DC
  Administered 2014-05-04 – 2014-05-05 (×2): 5000 [IU] via SUBCUTANEOUS
  Filled 2014-05-04 (×5): qty 1

## 2014-05-04 MED ORDER — ASPIRIN EC 81 MG PO TBEC: 81.0000 mg | DELAYED_RELEASE_TABLET | Freq: Every day | ORAL | Status: DC

## 2014-05-04 NOTE — H&P (Signed)
[facility] History and Physical  Ivan Allen WEX:[id number] DOB: [birth year] DOA: 05/04/2014  Referring physician: ED PCP: No PCP Per Patient  Specialists: Neuro  Chief Complaint: Syncope  HPI: 53 y/o ? 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 He sat down on a low wall at the site of the house and was feeling a little dizzy. He had no spinning of the room around his head but was noted to have lost consciousness for a couple of seconds. He came back to but felt some tingling and numbness on the right side of the face. He had no incontinence of urine or bowel had no tongue biting but tingling lasted for about 30 seconds and then resolved. He states that he does not use any other medications other than Lamictal for his depression but his UA is positive for amphetamine. 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 He smokes about 5-6 cigarettes a day He states that he had a quart of water and was trying to keep up with his fluid losses as he was working outside and he's not sure if he was able to the. He has a mild dull headache which has resolved his subsequently.  Emergency room workup Sodium 142 potassium 4.2 BUN 19, creatinine 1.1, UA was negative Amphetamine was positive on urine tox Hemoglobin 14.8, hematocrit 43.1  CT head was performed which showed no acute cranial abnormality  Review of Systems: Please see above  Past Medical History  Diagnosis Date  . Depression   . Anxiety    Past Surgical History  Procedure Laterality Date  . Tonsillectomy     Social History:  History   Social History Narrative  . No narrative on file    No Known Allergies  Family History  Problem Relation Age of Onset  . Hypertension Mother   . Hypertension Father     Prior to  Admission medications   Medication Sig Start Date End Date Taking? Authorizing Provider  aspirin EC 81 MG tablet Take 81 mg by mouth at bedtime.   Yes Historical Provider, MD  escitalopram (LEXAPRO) 10 MG tablet Take 20 mg by mouth at bedtime.    Yes Historical Provider, MD  lamoTRIgine (LAMICTAL) 200 MG tablet Take 200 mg by mouth 2 (two) times daily.    Yes Historical Provider, MD  OVER THE COUNTER MEDICATION Take 1 tablet by mouth 2 (two) times daily before lunch and supper. Equate acid reducer   Yes Historical Provider, MD   Physical Exam: Filed Vitals:   05/04/14 1600 05/04/14 1615 05/04/14 1630 05/04/14 1636  BP: 151/89 121/71 130/77   Pulse: 82 66 68   Temp:    98 F (36.7 C)  TempSrc:    Oral  Resp: 22 18 14    Height:      Weight:      SpO2: 96% 97% 94%      General:  Alert pleasant little sleepy  Eyes: EOMI, NCAT, no icterus or pallor  ENT: Soft supple, no thyromegaly  Neck: No JVD, no bruit  Cardiovascular: S1-S2 regular rate rhythm  Respiratory: Clear  Abdomen: Soft nontender no rebound  Skin: Intact  Musculoskeletal: Range of motion intact  Psychiatric: Euthymic  Neurologic: Power 5/5, reflexes 2/3 in major muscle groups such as  biceps triceps patella and Achilles. Finger-nose-finger test is within normal limits bilaterally. Temperature is normal, uvula midline, smile symmetric, gait not assessed  Labs on Admission:  Basic Metabolic Panel:  Recent Labs Lab 05/04/14 1310 05/04/14 1359  NA 142 142  K 4.3 4.2  CL 106 107  CO2 24  --   GLUCOSE 85 84  BUN 19 19  CREATININE 1.02 1.10  CALCIUM 9.5  --    Liver Function Tests:  Recent Labs Lab 05/04/14 1310  AST 20  ALT 19  ALKPHOS 73  BILITOT 0.3  PROT 7.0  ALBUMIN 4.3   No results found for this basename: LIPASE, AMYLASE,  in the last 168 hours No results found for this basename: AMMONIA,  in the last 168 hours CBC:  Recent Labs Lab 05/04/14 1310 05/04/14 1359  WBC 7.0  --    NEUTROABS 4.1  --   HGB 14.8 15.0  HCT 43.1 44.0  MCV 93.7  --   PLT 203  --    Cardiac Enzymes: No results found for this basename: CKTOTAL, CKMB, CKMBINDEX, TROPONINI,  in the last 168 hours  BNP (last 3 results) No results found for this basename: PROBNP,  in the last 8760 hours CBG: No results found for this basename: GLUCAP,  in the last 168 hours  Radiological Exams on Admission: Ct Head Wo Contrast  05/04/2014   CLINICAL DATA:  Code stroke ; syncopal episode and left-sided weakness  EXAM: CT HEAD WITHOUT CONTRAST  TECHNIQUE: Contiguous axial images were obtained from the base of the skull through the vertex without intravenous contrast.  COMPARISON:  No comparisons  FINDINGS: The ventricles are normal in size and position. There is no intracranial hemorrhage nor intracranial mass effect. There is no evidence of an evolving ischemic infarction. The cerebellum and brainstem are normal in density.  At bone window settings the observed portions of the paranasal sinuses and mastoid air cells are clear. There is no evidence of an acute skull fracture.  IMPRESSION: There is no evidence of an acute ischemic or hemorrhagic infarction. No other acute intracranial abnormality is demonstrated either.  These results were called by telephone at the time of interpretation on 05/04/2014 at 1:07 PM to Dr. Doy Mince, who verbally acknowledged these results.   Electronically Signed   By: David  Martinique   On: 05/04/2014 13:09    EKG: Independently reviewed. Normal sinus rhythm PR interval 0 point 08 QRS axis leftward, mild T wave elevations V3 through 6.  Assessment/Plan Active Problems:   Syncope-sounds like simple syncope-will obtain MRI brain as per my discussion with Dr. Doy Mince who thinks he needs to be admitted over night at least. Further workup for her is not completely necessary. ABCD 2 score was 2 and he is at low risk for further events. Mean an effect from prophylactic aspirin if he has had a  colonoscopy which shows no acute source of bleeding   Hypertension-monitor. This may be reactionary.   Amphetamine adverse reaction-patient questioned in detail. He states he only took to [employer] drinks.  He may benefit from substance abuse counseling he admits to this and we can help   Smoker-consult put  Acute superimposed on Chronic kidney disease-hydrate with saline 50 cc per hour  Depression-continue Lamictal 200 twice a day, Lexapro 20 each bedtime. Outpatient psychiatric followup  Time spent: [facility] Pager [phone]  If 7PM-7AM, please contact night-coverage [email or web address] Password [facility] 05/04/2014, 4:36 PM

## 2014-05-04 NOTE — ED Notes (Signed)
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 Patient then had a syncopal episode. Patient remembers waking up with his co-worker there. EMS states patient had a left sided facial droop with left arm weakness and a headache that patient stated started around 10am. Patient is not on any blood thinners, VSS, A/O at this time.

## 2014-05-04 NOTE — Code Documentation (Signed)
53yo male arriving to [facility] at 1250 via [place].  EMS reports that the patient was at work and sat down and passed out.  His coworker was not far away.  EMS activated and assessed left facial droop and left sided weakness.  Code Stroke activated.  Facial droop subsided en route per EMS.  Patient taken to CT on arrival. NIHSS 0 on arrival.  Patient reports feeling nauseated and extremely weak prior to the event.  Patient now reports slight headache.  Patient with a h/o depression and bipolar disorder.  Neurologist at bedside, TIA alert per MD.  Bedside handoff with ED RN.

## 2014-05-04 NOTE — Consult Note (Signed)
Referring Physician: Ashok Cordia    Chief Complaint: Code stroke  HPI:                                                                                                                                         Ivan Allen is an 53 y.o. male with no previous stroke history.  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  He believes this was around 1200.  He had to sit down due to feeling dizzy.  The next thing he recalls is waking up to his coworker calling his name.  He knew who was around him, where he was but still felt groggy.   He did feel as though his right side of his face was slightly numb and his coworkers told him his right face was drawn up. Currently he feels back to his normal self and has no facial droop. He has had one previous syncopal episode in past, years ago.   He is Lamictal for depression and Anxiety He does take a baby ASA daily ABCD2 0   Date last known well: Date: 05/04/2014 Time last known well: Time: 12:00 tPA Given: No: symptoms resolved.   Past Medical History  Diagnosis Date  . Depression   . Anxiety     Past Surgical History  Procedure Laterality Date  . Tonsillectomy      Family History  Problem Relation Age of Onset  . Hypertension Mother   . Hypertension Father    Social History:  reports that he has been smoking.  He does not have any smokeless tobacco history on file. His alcohol and drug histories are not on file.  Allergies: No Known Allergies  Medications:                                                                                                                           No current facility-administered medications for this encounter.   Current Outpatient Prescriptions  Medication Sig Dispense Refill  . aspirin EC 81 MG tablet Take 81 mg by mouth at bedtime.      Marland Kitchen escitalopram (LEXAPRO) 10 MG tablet Take 20 mg by mouth at bedtime.       . lamoTRIgine (LAMICTAL) 200 MG tablet Take 200 mg by mouth 2 (two)  times daily.       Marland Kitchen OVER THE  COUNTER MEDICATION Take 1 tablet by mouth 2 (two) times daily before lunch and supper. Equate acid reducer         ROS:                                                                                                                                       History obtained from the patient  General ROS: negative for - chills, fatigue, fever, night sweats, weight gain or weight loss Psychological ROS: negative for - behavioral disorder, hallucinations, memory difficulties, mood swings or suicidal ideation Ophthalmic ROS: negative for - blurry vision, double vision, eye pain or loss of vision ENT ROS: negative for - epistaxis, nasal discharge, oral lesions, sore throat, tinnitus or vertigo Allergy and Immunology ROS: negative for - hives or itchy/watery eyes Hematological and Lymphatic ROS: negative for - bleeding problems, bruising or swollen lymph nodes Endocrine ROS: negative for - galactorrhea, hair pattern changes, polydipsia/polyuria or temperature intolerance Respiratory ROS: negative for - cough, hemoptysis, shortness of breath or wheezing Cardiovascular ROS: negative for - chest pain, dyspnea on exertion, edema or irregular heartbeat Gastrointestinal ROS: negative for - abdominal pain, diarrhea, hematemesis, nausea/vomiting or stool incontinence Genito-Urinary ROS: negative for - dysuria, hematuria, incontinence or urinary frequency/urgency Musculoskeletal ROS: negative for - joint swelling or muscular weakness Neurological ROS: as noted in HPI Dermatological ROS: negative for rash and skin lesion changes  Neurologic Examination:                                                                                                      Blood pressure 130/77, pulse 68, temperature 98 F (36.7 C), temperature source Oral, resp. rate 14, height 5\' 11"  (1.803 m), weight 99.791 kg (220 lb), SpO2 94.00%.  Mental Status: Alert, oriented, thought content appropriate.   Speech fluent without evidence of aphasia.  Able to follow 3 step commands without difficulty. Cranial Nerves: II: Discs flat bilaterally; Visual fields grossly normal, pupils equal, round, reactive to light and accommodation III,IV, VI: ptosis not present, extra-ocular motions intact bilaterally V,VII: smile symmetric, facial light touch sensation normal bilaterally VIII: hearing normal bilaterally IX,X: gag reflex present XI: bilateral shoulder shrug XII: midline tongue extension without atrophy or fasciculations  Motor: Right : Upper extremity   5/5    Left:     Upper extremity   5/5  Lower extremity   5/5     Lower extremity   5/5 Tone and bulk:normal tone throughout; no atrophy noted Sensory: Pinprick  and light touch intact throughout, bilaterally Deep Tendon Reflexes:  Right: Upper Extremity   Left: Upper extremity   biceps (C-5 to C-6) 2/4   biceps (C-5 to C-6) 2/4 tricep (C7) 2/4    triceps (C7) 2/4 Brachioradialis (C6) 2/4  Brachioradialis (C6) 2/4  Lower Extremity Lower Extremity  quadriceps (L-2 to L-4) 3/4   quadriceps (L-2 to L-4) 3/4 Achilles (S1) 2/4   Achilles (S1) 2/4  Plantars: Right: downgoing   Left: downgoing Cerebellar: normal finger-to-nose,  normal heel-to-shin test Gait: normal CV: pulses palpable throughout    Lab Results: Basic Metabolic Panel:  Recent Labs Lab 05/04/14 1310 05/04/14 1359  NA 142 142  K 4.3 4.2  CL 106 107  CO2 24  --   GLUCOSE 85 84  BUN 19 19  CREATININE 1.02 1.10  CALCIUM 9.5  --     Liver Function Tests:  Recent Labs Lab 05/04/14 1310  AST 20  ALT 19  ALKPHOS 73  BILITOT 0.3  PROT 7.0  ALBUMIN 4.3   No results found for this basename: LIPASE, AMYLASE,  in the last 168 hours No results found for this basename: AMMONIA,  in the last 168 hours  CBC:  Recent Labs Lab 05/04/14 1310 05/04/14 1359  WBC 7.0  --   NEUTROABS 4.1  --   HGB 14.8 15.0  HCT 43.1 44.0  MCV 93.7  --   PLT 203  --      Cardiac Enzymes: No results found for this basename: CKTOTAL, CKMB, CKMBINDEX, TROPONINI,  in the last 168 hours  Lipid Panel: No results found for this basename: CHOL, TRIG, HDL, CHOLHDL, VLDL, LDLCALC,  in the last 168 hours  CBG: No results found for this basename: GLUCAP,  in the last 168 hours  Microbiology: Results for orders placed in visit on 03/10/13  WOUND CULTURE     Status: None   Collection Time    03/10/13  6:13 PM      Result Value Ref Range Status   Culture     Final   Value: Abundant METHICILLIN RESISTANT STAPHYLOCOCCUS AUREUS   Gram Stain Rare   Final   Gram Stain WBC present-both PMN and Mononuclear   Final   Gram Stain Few Squamous Epithelial Cells Present   Final   Gram Stain Abundant GRAM POSITIVE COCCI IN CLUSTERS In Pairs   Final   Organism ID, Bacteria METHICILLIN RESISTANT STAPHYLOCOCCUS AUREUS   Final   Comment: Rifampin and Gentamicin should not be used as     single drugs for treatment of Staph infections.     This organism DOES NOT demonstrate inducible     Clindamycin resistance in vitro.    Coagulation Studies:  Recent Labs  05/04/14 1310  LABPROT 12.6  INR 0.96    Imaging: Ct Head Wo Contrast  05/04/2014   CLINICAL DATA:  Code stroke ; syncopal episode and left-sided weakness  EXAM: CT HEAD WITHOUT CONTRAST  TECHNIQUE: Contiguous axial images were obtained from the base of the skull through the vertex without intravenous contrast.  COMPARISON:  No comparisons  FINDINGS: The ventricles are normal in size and position. There is no intracranial hemorrhage nor intracranial mass effect. There is no evidence of an evolving ischemic infarction. The cerebellum and brainstem are normal in density.  At bone window settings the observed portions of the paranasal sinuses and mastoid air cells are clear. There is no evidence of an acute skull fracture.  IMPRESSION: There is no evidence  of an acute ischemic or hemorrhagic infarction. No other acute  intracranial abnormality is demonstrated either.  These results were called by telephone at the time of interpretation on 05/04/2014 at 1:07 PM to Dr. Doy Mince, who verbally acknowledged these results.   Electronically Signed   By: David  Martinique   On: 05/04/2014 13:09    Etta Quill PA-C [facility] [phone]  05/04/2014, 4:41 PM   Patient seen and examined.  Clinical course and management discussed.  Necessary edits performed.  I agree with the above.  Assessment and plan of care developed and discussed below.     Assessment: 53 y.o. male presenting after an episode of syncope with questionable facial droop.  Patient now back to baseline.  Drug screen positive for amphetamines.  Unclear if this may have played a part in his presentation.  Head CT reviewed and unremarkable.    Stroke Risk Factors - none  Recommendations: 1.  MRI of the brain without contrast.  Would not perform stroke work up unless imaging diagnostic of an acute infarct. 2.  Increase ASA 325mg  daily 3.  Telemetry 4.  Frequent neuro checks.     Alexis Goodell, MD [facility] [phone]  05/04/2014  4:55 PM

## 2014-05-04 NOTE — ED Provider Notes (Signed)
CSN: [id number]     Arrival date & time 05/04/14  1302 History   First MD Initiated Contact with Patient 05/04/14 1313     Chief Complaint  Patient presents with  . Code Stroke    Patient is a 53 y.o. with PMH of bipolar disorder who presents after syncopal episode and with reported left sided facial droop and left sided arm weakness.  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  He sat down and the next thing he remembers in waking up on ground with co-worker standing over top.  EMS reported the left sided facial droop and LUE weakness.  In transit these symptoms resolved and he was brought under TIA protocol.  Upon arrival in the ED, patient complaints of mild nausea but otherwise he denies HA, weakness, numbness, or trouble with speech.       An emergency department physician performed an initial assessment on this suspected stroke patient at 1250. (Consider location/radiation/quality/duration/timing/severity/associated sxs/prior Treatment) Patient is a 53 y.o. male presenting with general illness. The history is provided by the patient, the EMS personnel and medical records. No language interpreter was used.  Illness Onset quality:  Sudden Timing:  Rare Progression:  Resolved Chronicity:  New Associated symptoms: nausea   Associated symptoms: no chest pain, no congestion, no fever, no shortness of breath and no vomiting     Past Medical History  Diagnosis Date  . Depression   . Anxiety    Past Surgical History  Procedure Laterality Date  . Tonsillectomy     History reviewed. No pertinent family history. History  Substance Use Topics  . Smoking status: Current Every Day Smoker  . Smokeless tobacco: Not on file  . Alcohol Use: Not on file    Review of Systems  Constitutional: Negative for fever.  HENT: Negative for congestion.   Respiratory: Negative for shortness of breath.   Cardiovascular:  Negative for chest pain.  Gastrointestinal: Positive for nausea. Negative for vomiting.  All other systems reviewed and are negative.     Allergies  Review of patient's allergies indicates no known allergies.  Home Medications   Prior to Admission medications   Medication Sig Start Date End Date Taking? Authorizing Provider  escitalopram (LEXAPRO) 10 MG tablet Take 10 mg by mouth daily.    Historical Provider, MD  HYDROcodone-acetaminophen (NORCO/VICODIN) 5-325 MG per tablet Take 1 tablet by mouth every 6 (six) hours as needed for pain. 03/10/13   Shawnee Knapp, MD  lamoTRIgine (LAMICTAL) 200 MG tablet Take 200 mg by mouth daily.    Historical Provider, MD  sulfamethoxazole-trimethoprim (BACTRIM DS,SEPTRA DS) 800-160 MG per tablet Take 2 tablets by mouth 2 (two) times daily. 03/29/14   Ellison Carwin, MD   SpO2 94% Physical Exam  Nursing note and vitals reviewed. Constitutional: He is oriented to person, place, and time. He appears well-developed and well-nourished.  HENT:  Head: Normocephalic and atraumatic.  Right Ear: External ear normal.  Left Ear: External ear normal.  Nose: Nose normal.  Mouth/Throat: Oropharynx is clear and moist.  Eyes: Conjunctivae and EOM are normal. Pupils are equal, round, and reactive to light.  Neck: Normal range of motion. Neck supple.  Cardiovascular: Normal rate, normal heart sounds and intact distal pulses.   Pulmonary/Chest: Effort normal and breath sounds normal.  Abdominal: Soft. Bowel sounds are normal.  Musculoskeletal: Normal range of motion. He exhibits no edema and no tenderness.  Neurological: He  is alert and oriented to person, place, and time. He has normal reflexes. No cranial nerve deficit or sensory deficit. He exhibits normal muscle tone. Coordination normal.    ED Course  Procedures (including critical care time) Labs Review Labs Reviewed  COMPREHENSIVE METABOLIC PANEL - Abnormal; Notable for the following:    GFR calc non Af  Amer 82 (*)    All other components within normal limits  URINE RAPID DRUG SCREEN (HOSP PERFORMED) - Abnormal; Notable for the following:    Amphetamines POSITIVE (*)    All other components within normal limits  ETHANOL  PROTIME-INR  APTT  CBC  DIFFERENTIAL  URINALYSIS, ROUTINE W REFLEX MICROSCOPIC  I-STAT CHEM 8, ED  I-STAT TROPOININ, ED  I-STAT TROPOININ, ED    Imaging Review Ct Head Wo Contrast  05/04/2014   CLINICAL DATA:  Code stroke ; syncopal episode and left-sided weakness  EXAM: CT HEAD WITHOUT CONTRAST  TECHNIQUE: Contiguous axial images were obtained from the base of the skull through the vertex without intravenous contrast.  COMPARISON:  No comparisons  FINDINGS: The ventricles are normal in size and position. There is no intracranial hemorrhage nor intracranial mass effect. There is no evidence of an evolving ischemic infarction. The cerebellum and brainstem are normal in density.  At bone window settings the observed portions of the paranasal sinuses and mastoid air cells are clear. There is no evidence of an acute skull fracture.  IMPRESSION: There is no evidence of an acute ischemic or hemorrhagic infarction. No other acute intracranial abnormality is demonstrated either.  These results were called by telephone at the time of interpretation on 05/04/2014 at 1:07 PM to Dr. Doy Mince, who verbally acknowledged these results.   Electronically Signed   By: David  Martinique   On: 05/04/2014 13:09     EKG Interpretation   Date/Time:  Monday May 04 2014 13:06:13 EDT Ventricular Rate:  96 PR Interval:  178 QRS Duration: 107 QT Interval:  352 QTC Calculation: 445 R Axis:   -115 Text Interpretation:  Sinus rhythm Right superior axis Probable right  ventricular hypertrophy No previous tracing Confirmed by Ashok Cordia  MD, Lennette Bihari  ([id number]) on 05/04/2014 3:13:23 PM      MDM   Final diagnoses:  Syncope    Patient presents to the ED after syncopal episode and left sided facial droop  and LUE weakness.  Symptoms resolved just PTA in ED.  Code stroke initiated upon arrival.  AF and VS unremarkable.  PE as above and remarkable for no focal neuro deficit.  Code stroke imaging/labs/ekg revealed UDS positive for amphetamines but otherwise unremarkable.  Given concern for TIA and syncope it was felt that admission to hospitalist service was appropriate.  Hospitalist consulted and patient to be admitted to telemetry floor for further evaluation and risk stratification.        Corlis Leak, MD 05/04/14 [phone]

## 2014-05-05 ENCOUNTER — Encounter: Payer: Self-pay | Admitting: Family Medicine

## 2014-05-05 DIAGNOSIS — R55 Syncope and collapse: Secondary | ICD-10-CM | POA: Diagnosis not present

## 2014-05-05 DIAGNOSIS — G459 Transient cerebral ischemic attack, unspecified: Secondary | ICD-10-CM

## 2014-05-05 LAB — LIPID PANEL
CHOL/HDL RATIO: 6.2 ratio
CHOLESTEROL: 230 mg/dL — AB (ref 0–200)
HDL: 37 mg/dL — ABNORMAL LOW (ref >39)
LDL Cholesterol: 163 mg/dL — ABNORMAL HIGH (ref 0–99)
Triglycerides: 148 mg/dL (ref <150)
VLDL: 30 mg/dL (ref 0–40)

## 2014-05-05 LAB — GLUCOSE, CAPILLARY
Glucose-Capillary: 101 mg/dL — ABNORMAL HIGH (ref 70–99)
Glucose-Capillary: 118 mg/dL — ABNORMAL HIGH (ref 70–99)

## 2014-05-05 LAB — HEMOGLOBIN A1C
Hgb A1c MFr Bld: 5.6 % (ref <5.7)
Mean Plasma Glucose: 114 mg/dL (ref <117)

## 2014-05-05 MED ORDER — EZETIMIBE 10 MG PO TABS
10.0000 mg | ORAL_TABLET | Freq: Every day | ORAL | Status: DC
  Filled 2014-05-05: qty 1

## 2014-05-05 MED ORDER — EZETIMIBE 10 MG PO TABS: 10.0000 mg | ORAL_TABLET | Freq: Every day | ORAL | Status: DC

## 2014-05-05 NOTE — ED Provider Notes (Signed)
I saw and evaluated the patient, reviewed the resident's note and I agree with the findings and plan.   EKG Interpretation   Date/Time:  Monday May 04 2014 13:06:13 EDT Ventricular Rate:  96 PR Interval:  178 QRS Duration: 107 QT Interval:  352 QTC Calculation: 445 R Axis:   -115 Text Interpretation:  Sinus rhythm Right superior axis Probable right  ventricular hypertrophy No previous tracing Confirmed by Ashok Cordia  MD, Lennette Bihari  ([id number]) on 05/04/2014 3:13:23 PM      Pt s/p syncopal episode, ems noting facial droop and LUE weakness. Facial droop and ext weakness resolved on ED arrival. Pt denies cp or discomfort. No palpitations. No headaches. Ct. Labs. Monitor. Neuro consult. Admission.   Mirna Mires, MD 05/05/14 [phone]

## 2014-05-05 NOTE — Progress Notes (Signed)
*  PRELIMINARY RESULTS* Vascular Ultrasound Carotid Duplex (Doppler) has been completed.  Findings suggest 1-39% internal carotid artery stenosis bilaterally. Vertebral arteries are patent with antegrade flow.  05/05/2014 11:22 AM Maudry Mayhew, RVT, RDCS, RDMS

## 2014-05-05 NOTE — Progress Notes (Signed)
  Echocardiogram 2D Echocardiogram has been performed.  Valinda Hoar 05/05/2014, 11:08 AM

## 2014-05-05 NOTE — Progress Notes (Signed)
Patient discharged to home.  Patient alert, oriented, verbally responsive, breathing regular and non-labored throughout, no s/s of distress noted throughout, no c/o pain throughout.  Discharge instructions verbalized to patient thoroughly.  Patient verbalized understanding throughout.  Dr. Verlon Au called and asked if it was ok for patient to drive home.  Patient's son brought patients car to [employer] parking garage today for patient to drive home.  Patient admitted with syncopal episode.  Dr. Verlon Au informed of this.  Dr. Verlon Au stated that his recommendation is for patient not to drive home and to have a family member or friend drive him home today.  Patient informed of Dr. Arlyss Queen recommendation.  Patient stated, "I'll be ok driving home."  Patient informed of all risks associated with driving home.  Patient still states, "I'll be ok, I don't live far from here."  Patient left unit per wheelchair accompanied by transportation.  VS WNL.  Dirk Dress 05/05/2014

## 2014-05-05 NOTE — Progress Notes (Signed)
PT Cancellation Note  Patient Details Name: Ivan Allen MRN: [id number] DOB: 11-12-1961   Cancelled Treatment:    Reason Eval/Treat Not Completed: PT screened, no needs identified, will sign off; spoke with patient who denies any further dizziness or mobility issues.  RN reports saw him walk to wheelchair without difficulty.  Will sign off.   Max Sane 05/05/2014, 11:33 AM

## 2014-05-05 NOTE — Progress Notes (Deleted)
. Physician Discharge Summary  Ivan Allen VFI:[id number] DOB: 1961-03-14 DOA: 05/04/2014  PCP: Jenny Reichmann, MD  Admit date: 05/04/2014 Discharge date: 05/05/2014  Time spent:  30 minutes  Recommendations for Outpatient Follow-up:   1. Continue ASA 81 daily.   2. Consider age recommended screening-Colonoscopy etc.   Discharge Diagnoses:  Active Problems:   Syncope   Hypertension   Amphetamine adverse reaction   Smoker   Chronic kidney disease   Discharge Condition: good  Diet recommendation: regular  Filed Weights   05/04/14 1327 05/04/14 1837 05/05/14 0407  Weight: 99.791 kg (220 lb) 100.517 kg (221 lb 9.6 oz) 95.981 kg (211 lb 9.6 oz)    History of present illness:   53 y/o ? 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 He sat down on a low wall at the site of the house and was feeling a little dizzy.  He had no spinning of the room around his head but was noted to have lost consciousness for a couple of seconds. He came back to but felt some tingling and numbness on the right side of the face. He had no incontinence of urine or bowel had no tongue biting but tingling lasted for about 30 seconds and then resolved. Patent had a low ABCD 2 score, but he was kept in the hospital overnight for observations and had an MRI performed which was negative. He was counseled to take ASA 781 mg and it was thought this was simple syncope on d/c.   Discharge Exam: Filed Vitals:   05/05/14 0908  BP: 108/67  Pulse: 110  Temp:   Resp:     General: alert pleasant oriented Cardiovascular: s1 s 2no m/r/g Respiratory: clear  Discharge Instructions You were cared for by a hospitalist during your hospital stay. If you have any questions about your discharge medications or the care you received while you were in the hospital after  you are discharged, you can call the unit and asked to speak with the hospitalist on call if the hospitalist that took care of you is not available. Once you are discharged, your primary care physician will handle any further medical issues. Please note that NO REFILLS for any discharge medications will be authorized once you are discharged, as it is imperative that you return to your primary care physician (or establish a relationship with a primary care physician if you do not have one) for your aftercare needs so that they can reassess your need for medications and monitor your lab values.  Discharge Orders   Future Orders Complete By Expires   Diet - low sodium heart healthy  As directed    Discharge instructions  As directed    Increase activity slowly  As directed        Medication List         aspirin EC 81 MG tablet  Take 81 mg by mouth at bedtime.     escitalopram 10 MG tablet  Commonly known as:  LEXAPRO  Take 20 mg by mouth at bedtime.     ezetimibe 10 MG tablet  Commonly known as:  ZETIA  Take 1 tablet (10 mg total) by mouth daily.     lamoTRIgine 200 MG tablet  Commonly known as:  LAMICTAL  Take 200 mg by mouth 2 (two) times daily.  OVER THE COUNTER MEDICATION  Take 1 tablet by mouth 2 (two) times daily before lunch and supper. Equate acid reducer       No Known Allergies     Follow-up Information   Follow up with DAUB, STEVE A, MD. Schedule an appointment as soon as possible for a visit in 1 week.   Specialty:  Family Medicine   Contact information:   [place] [address] [phone]        The results of significant diagnostics from this hospitalization (including imaging, microbiology, ancillary and laboratory) are listed below for reference.    Significant Diagnostic Studies: Ct Head Wo Contrast  05/04/2014   CLINICAL DATA:  Code stroke ; syncopal episode and left-sided weakness  EXAM: CT HEAD WITHOUT CONTRAST  TECHNIQUE:  Contiguous axial images were obtained from the base of the skull through the vertex without intravenous contrast.  COMPARISON:  No comparisons  FINDINGS: The ventricles are normal in size and position. There is no intracranial hemorrhage nor intracranial mass effect. There is no evidence of an evolving ischemic infarction. The cerebellum and brainstem are normal in density.  At bone window settings the observed portions of the paranasal sinuses and mastoid air cells are clear. There is no evidence of an acute skull fracture.  IMPRESSION: There is no evidence of an acute ischemic or hemorrhagic infarction. No other acute intracranial abnormality is demonstrated either.  These results were called by telephone at the time of interpretation on 05/04/2014 at 1:07 PM to Dr. Doy Mince, who verbally acknowledged these results.   Electronically Signed   By: David  Martinique   On: 05/04/2014 13:09   Mri Brain Without Contrast  05/04/2014   CLINICAL DATA:  Episode of syncope and a questionable facial droop on the right. Patient is now back to baseline. Evaluate for stroke/TIA.  EXAM: MRI HEAD WITHOUT CONTRAST  MRA HEAD WITHOUT CONTRAST  TECHNIQUE: Multiplanar, multiecho pulse sequences of the brain and surrounding structures were obtained without intravenous contrast. Angiographic images of the head were obtained using MRA technique without contrast.  COMPARISON:  Head CT 05/04/2014  FINDINGS: MRI HEAD FINDINGS  There is no acute infarct. Ventricles and sulci are normal for age. There is no evidence of intracranial hemorrhage, mass, midline shift, or extra-axial fluid collection. No brain parenchymal signal abnormality is identified.  Orbits are unremarkable. Paranasal sinuses and mastoid air cells are clear. Major intracranial vascular flow voids are preserved. Calvarium and scalp soft tissues are unremarkable.  MRA HEAD FINDINGS  Images are mildly degraded by motion.  Visualized distal vertebral arteries are patent with the  left being mildly dominant. PICA origins are patent. SCA origins are patent. Basilar artery is patent without stenosis. PCAs are unremarkable.  Internal carotid arteries are patent from skullbase to carotid termini. There is mild irregularity of the proximal left MCA. Left anterior temporal artery appears attenuated compared to the right. Right MCA is unremarkable. ACAs are unremarkable. No intracranial aneurysm is identified.  IMPRESSION: 1. Unremarkable appearance of the brain. No evidence of acute infarct. 2. Mild proximal left MCA irregularity, suggestive of mild atherosclerosis. No evidence of major intracranial arterial occlusion.   Electronically Signed   By: Logan Bores   On: 05/04/2014 22:30   Mr Jodene Nam Head/brain Wo Cm  05/04/2014   CLINICAL DATA:  Episode of syncope and a questionable facial droop on the right. Patient is now back to baseline. Evaluate for stroke/TIA.  EXAM: MRI HEAD WITHOUT CONTRAST  MRA HEAD WITHOUT  CONTRAST  TECHNIQUE: Multiplanar, multiecho pulse sequences of the brain and surrounding structures were obtained without intravenous contrast. Angiographic images of the head were obtained using MRA technique without contrast.  COMPARISON:  Head CT 05/04/2014  FINDINGS: MRI HEAD FINDINGS  There is no acute infarct. Ventricles and sulci are normal for age. There is no evidence of intracranial hemorrhage, mass, midline shift, or extra-axial fluid collection. No brain parenchymal signal abnormality is identified.  Orbits are unremarkable. Paranasal sinuses and mastoid air cells are clear. Major intracranial vascular flow voids are preserved. Calvarium and scalp soft tissues are unremarkable.  MRA HEAD FINDINGS  Images are mildly degraded by motion.  Visualized distal vertebral arteries are patent with the left being mildly dominant. PICA origins are patent. SCA origins are patent. Basilar artery is patent without stenosis. PCAs are unremarkable.  Internal carotid arteries are patent from  skullbase to carotid termini. There is mild irregularity of the proximal left MCA. Left anterior temporal artery appears attenuated compared to the right. Right MCA is unremarkable. ACAs are unremarkable. No intracranial aneurysm is identified.  IMPRESSION: 1. Unremarkable appearance of the brain. No evidence of acute infarct. 2. Mild proximal left MCA irregularity, suggestive of mild atherosclerosis. No evidence of major intracranial arterial occlusion.   Electronically Signed   By: Logan Bores   On: 05/04/2014 22:30    Microbiology: No results found for this or any previous visit (from the past 240 hour(s)).   Labs: Basic Metabolic Panel:  Recent Labs Lab 05/04/14 1310 05/04/14 1359  NA 142 142  K 4.3 4.2  CL 106 107  CO2 24  --   GLUCOSE 85 84  BUN 19 19  CREATININE 1.02 1.10  CALCIUM 9.5  --    Liver Function Tests:  Recent Labs Lab 05/04/14 1310  AST 20  ALT 19  ALKPHOS 73  BILITOT 0.3  PROT 7.0  ALBUMIN 4.3   No results found for this basename: LIPASE, AMYLASE,  in the last 168 hours No results found for this basename: AMMONIA,  in the last 168 hours CBC:  Recent Labs Lab 05/04/14 1310 05/04/14 1359  WBC 7.0  --   NEUTROABS 4.1  --   HGB 14.8 15.0  HCT 43.1 44.0  MCV 93.7  --   PLT 203  --    Cardiac Enzymes: No results found for this basename: CKTOTAL, CKMB, CKMBINDEX, TROPONINI,  in the last 168 hours BNP: BNP (last 3 results) No results found for this basename: PROBNP,  in the last 8760 hours CBG:  Recent Labs Lab 05/05/14 0045 05/05/14 0747  GLUCAP 118* 101*       Signed:  Jai-Gurmukh Randie Tallarico  [facility] 05/05/2014, 10:16 AM

## 2014-05-11 NOTE — Discharge Summary (Signed)
. Physician Discharge Summary  Ivan Allen WEX:[id number] DOB: 1961/02/18 DOA: 05/04/2014  PCP: Jenny Reichmann, MD  Admit date: 05/04/2014 Discharge date: 05/11/2014  Time spent:  30 minutes  Recommendations for Outpatient Follow-up:   1. Continue ASA 81 daily.   2. Consider age recommended screening-Colonoscopy etc.   Discharge Diagnoses:  Active Problems:   Syncope   Hypertension   Amphetamine adverse reaction   Smoker   Chronic kidney disease   Discharge Condition: good  Diet recommendation: regular  Filed Weights   05/04/14 1327 05/04/14 1837 05/05/14 0407  Weight: 99.791 kg (220 lb) 100.517 kg (221 lb 9.6 oz) 95.981 kg (211 lb 9.6 oz)    History of present illness:   53 y/o ? 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 He sat down on a low wall at the site of the house and was feeling a little dizzy.  He had no spinning of the room around his head but was noted to have lost consciousness for a couple of seconds. He came back to but felt some tingling and numbness on the right side of the face. He had no incontinence of urine or bowel had no tongue biting but tingling lasted for about 30 seconds and then resolved. Ivan Allen had a low ABCD 2 score, but he was kept in the hospital overnight for observations and had an MRI performed which was negative. He was counseled to take ASA 781 mg and it was thought this was simple syncope on d/c.   Discharge Exam: Filed Vitals:   05/05/14 0908  BP: 108/67  Pulse: 110  Temp:   Resp:     General: alert pleasant oriented Cardiovascular: s1 s 2no m/r/g Respiratory: clear  Discharge Instructions You were cared for by a hospitalist during your hospital stay. If you have any questions about your discharge medications or the care you received while you were in the hospital after  you are discharged, you can call the unit and asked to speak with the hospitalist on call if the hospitalist that took care of you is not available. Once you are discharged, your primary care physician will handle any further medical issues. Please note that NO REFILLS for any discharge medications will be authorized once you are discharged, as it is imperative that you return to your primary care physician (or establish a relationship with a primary care physician if you do not have one) for your aftercare needs so that they can reassess your need for medications and monitor your lab values.      Discharge Instructions   Diet - low sodium heart healthy    Complete by:  As directed      Discharge instructions    Complete by:  As directed   Follow with your regular doctor Continue Aspirin 81 mg daily-u may take OTC preparation Consider getting a colonoscopy soon. Limit energy drinks and caffeine     Increase activity slowly    Complete by:  As directed             Medication List         aspirin EC 81 MG tablet  Take 81 mg by mouth at bedtime.     escitalopram 10 MG tablet  Commonly known as:  LEXAPRO  Take 20 mg by mouth at bedtime.  ezetimibe 10 MG tablet  Commonly known as:  ZETIA  Take 1 tablet (10 mg total) by mouth daily.     lamoTRIgine 200 MG tablet  Commonly known as:  LAMICTAL  Take 200 mg by mouth 2 (two) times daily.     OVER THE COUNTER MEDICATION  Take 1 tablet by mouth 2 (two) times daily before lunch and supper. Equate acid reducer       No Known Allergies Follow-up Information   Follow up with DAUB, STEVE A, MD. Schedule an appointment as soon as possible for a visit in 1 week.   Specialty:  Family Medicine   Contact information:   [place] [address] [phone]        The results of significant diagnostics from this hospitalization (including imaging, microbiology, ancillary and laboratory) are listed below for reference.     Significant Diagnostic Studies: Ct Head Wo Contrast  05/04/2014   CLINICAL DATA:  Code stroke ; syncopal episode and left-sided weakness  EXAM: CT HEAD WITHOUT CONTRAST  TECHNIQUE: Contiguous axial images were obtained from the base of the skull through the vertex without intravenous contrast.  COMPARISON:  No comparisons  FINDINGS: The ventricles are normal in size and position. There is no intracranial hemorrhage nor intracranial mass effect. There is no evidence of an evolving ischemic infarction. The cerebellum and brainstem are normal in density.  At bone window settings the observed portions of the paranasal sinuses and mastoid air cells are clear. There is no evidence of an acute skull fracture.  IMPRESSION: There is no evidence of an acute ischemic or hemorrhagic infarction. No other acute intracranial abnormality is demonstrated either.  These results were called by telephone at the time of interpretation on 05/04/2014 at 1:07 PM to Dr. Doy Mince, who verbally acknowledged these results.   Electronically Signed   By: David  Martinique   On: 05/04/2014 13:09   Mri Brain Without Contrast  05/04/2014   CLINICAL DATA:  Episode of syncope and a questionable facial droop on the right. Patient is now back to baseline. Evaluate for stroke/TIA.  EXAM: MRI HEAD WITHOUT CONTRAST  MRA HEAD WITHOUT CONTRAST  TECHNIQUE: Multiplanar, multiecho pulse sequences of the brain and surrounding structures were obtained without intravenous contrast. Angiographic images of the head were obtained using MRA technique without contrast.  COMPARISON:  Head CT 05/04/2014  FINDINGS: MRI HEAD FINDINGS  There is no acute infarct. Ventricles and sulci are normal for age. There is no evidence of intracranial hemorrhage, mass, midline shift, or extra-axial fluid collection. No brain parenchymal signal abnormality is identified.  Orbits are unremarkable. Paranasal sinuses and mastoid air cells are clear. Major intracranial vascular flow  voids are preserved. Calvarium and scalp soft tissues are unremarkable.  MRA HEAD FINDINGS  Images are mildly degraded by motion.  Visualized distal vertebral arteries are Ivan Allen with the left being mildly dominant. PICA origins are Ivan Allen. SCA origins are Ivan Allen. Basilar artery is Ivan Allen without stenosis. PCAs are unremarkable.  Internal carotid arteries are Ivan Allen from skullbase to carotid termini. There is mild irregularity of the proximal left MCA. Left anterior temporal artery appears attenuated compared to the right. Right MCA is unremarkable. ACAs are unremarkable. No intracranial aneurysm is identified.  IMPRESSION: 1. Unremarkable appearance of the brain. No evidence of acute infarct. 2. Mild proximal left MCA irregularity, suggestive of mild atherosclerosis. No evidence of major intracranial arterial occlusion.   Electronically Signed   By: Logan Bores   On: 05/04/2014 22:30  Mr Jodene Nam Head/brain Wo Cm  05/04/2014   CLINICAL DATA:  Episode of syncope and a questionable facial droop on the right. Patient is now back to baseline. Evaluate for stroke/TIA.  EXAM: MRI HEAD WITHOUT CONTRAST  MRA HEAD WITHOUT CONTRAST  TECHNIQUE: Multiplanar, multiecho pulse sequences of the brain and surrounding structures were obtained without intravenous contrast. Angiographic images of the head were obtained using MRA technique without contrast.  COMPARISON:  Head CT 05/04/2014  FINDINGS: MRI HEAD FINDINGS  There is no acute infarct. Ventricles and sulci are normal for age. There is no evidence of intracranial hemorrhage, mass, midline shift, or extra-axial fluid collection. No brain parenchymal signal abnormality is identified.  Orbits are unremarkable. Paranasal sinuses and mastoid air cells are clear. Major intracranial vascular flow voids are preserved. Calvarium and scalp soft tissues are unremarkable.  MRA HEAD FINDINGS  Images are mildly degraded by motion.  Visualized distal vertebral arteries are Ivan Allen with the  left being mildly dominant. PICA origins are Ivan Allen. SCA origins are Ivan Allen. Basilar artery is Ivan Allen without stenosis. PCAs are unremarkable.  Internal carotid arteries are Ivan Allen from skullbase to carotid termini. There is mild irregularity of the proximal left MCA. Left anterior temporal artery appears attenuated compared to the right. Right MCA is unremarkable. ACAs are unremarkable. No intracranial aneurysm is identified.  IMPRESSION: 1. Unremarkable appearance of the brain. No evidence of acute infarct. 2. Mild proximal left MCA irregularity, suggestive of mild atherosclerosis. No evidence of major intracranial arterial occlusion.   Electronically Signed   By: Logan Bores   On: 05/04/2014 22:30    Microbiology: No results found for this or any previous visit (from the past 240 hour(s)).   Labs: Basic Metabolic Panel: No results found for this basename: NA, K, CL, CO2, GLUCOSE, BUN, CREATININE, CALCIUM, MG, PHOS,  in the last 168 hours Liver Function Tests: No results found for this basename: AST, ALT, ALKPHOS, BILITOT, PROT, ALBUMIN,  in the last 168 hours No results found for this basename: LIPASE, AMYLASE,  in the last 168 hours No results found for this basename: AMMONIA,  in the last 168 hours CBC: No results found for this basename: WBC, NEUTROABS, HGB, HCT, MCV, PLT,  in the last 168 hours Cardiac Enzymes: No results found for this basename: CKTOTAL, CKMB, CKMBINDEX, TROPONINI,  in the last 168 hours BNP: BNP (last 3 results) No results found for this basename: PROBNP,  in the last 8760 hours CBG:  Recent Labs Lab 05/05/14 0045 05/05/14 0747  GLUCAP 118* 101*       Signed:  Jai-Gurmukh Kora Groom  [facility] 05/11/2014, 7:28 PM

## 2014-10-20 ENCOUNTER — Ambulatory Visit (INDEPENDENT_AMBULATORY_CARE_PROVIDER_SITE_OTHER): Payer: BC Managed Care – PPO | Admitting: Family Medicine

## 2014-10-20 VITALS — BP 124/80 | HR 76 | Temp 98.1°F | Resp 16 | Ht 71.0 in | Wt 219.0 lb

## 2014-10-20 DIAGNOSIS — Z720 Tobacco use: Secondary | ICD-10-CM

## 2014-10-20 DIAGNOSIS — N189 Chronic kidney disease, unspecified: Secondary | ICD-10-CM

## 2014-10-20 DIAGNOSIS — F17200 Nicotine dependence, unspecified, uncomplicated: Secondary | ICD-10-CM

## 2014-10-20 DIAGNOSIS — L03119 Cellulitis of unspecified part of limb: Secondary | ICD-10-CM

## 2014-10-20 DIAGNOSIS — E785 Hyperlipidemia, unspecified: Secondary | ICD-10-CM

## 2014-10-20 DIAGNOSIS — L02419 Cutaneous abscess of limb, unspecified: Secondary | ICD-10-CM

## 2014-10-20 LAB — POCT CBC
GRANULOCYTE PERCENT: 56.5 % (ref 37–80)
HCT, POC: 47.5 % (ref 43.5–53.7)
HEMOGLOBIN: 15 g/dL (ref 14.1–18.1)
Lymph, poc: 3.8 — AB (ref 0.6–3.4)
MCH, POC: 30.7 pg (ref 27–31.2)
MCHC: 31.7 g/dL — AB (ref 31.8–35.4)
MCV: 97.1 fL — AB (ref 80–97)
MID (cbc): 0.8 (ref 0–0.9)
MPV: 7.8 fL (ref 0–99.8)
POC GRANULOCYTE: 6 (ref 2–6.9)
POC LYMPH PERCENT: 35.9 %L (ref 10–50)
POC MID %: 7.6 %M (ref 0–12)
Platelet Count, POC: 213 10*3/uL (ref 142–424)
RBC: 4.89 M/uL (ref 4.69–6.13)
RDW, POC: 13.7 %
WBC: 10.6 10*3/uL — AB (ref 4.6–10.2)

## 2014-10-20 MED ORDER — DOXYCYCLINE HYCLATE 100 MG PO CAPS: 100.0000 mg | ORAL_CAPSULE | Freq: Two times a day (BID) | ORAL | Status: DC

## 2014-10-20 MED ORDER — HYDROCODONE-ACETAMINOPHEN 5-325 MG PO TABS: 1.0000 | ORAL_TABLET | Freq: Four times a day (QID) | ORAL | Status: DC | PRN

## 2014-10-20 NOTE — Progress Notes (Signed)
Subjective:    Patient ID: Ivan Allen, male    DOB: 31-Aug-1961, 53 y.o.   MRN: [id number] This chart was scribed for Delman Cheadle, MD by Marti Sleigh, Medical Scribe. This patient was seen in Room 1 and the patient's care was started a 5:12 PM.  Chief Complaint  Patient presents with  . sore on leg    right leg     HPI Past Medical History  Diagnosis Date  . Depression   . Anxiety     No Known Allergies Current Outpatient Prescriptions on File Prior to Visit  Medication Sig Dispense Refill  . aspirin EC 81 MG tablet Take 81 mg by mouth at bedtime.      Marland Kitchen escitalopram (LEXAPRO) 10 MG tablet Take 20 mg by mouth at bedtime.       . lamoTRIgine (LAMICTAL) 200 MG tablet Take 200 mg by mouth 2 (two) times daily.       Marland Kitchen OVER THE COUNTER MEDICATION Take 1 tablet by mouth 2 (two) times daily before lunch and supper. Equate acid reducer      . ezetimibe (ZETIA) 10 MG tablet Take 1 tablet (10 mg total) by mouth daily.  30 tablet  0   No current facility-administered medications on file prior to visit.   HPI Comments: Ivan Allen is a 53 y.o. male with a hx of abscess, HTN and chronic kidney disease who presents to [facility] complaining of a wound on right anterior shin that started Sunday. Pt endorses associated pain, heat and swelling. Pt denies MOI. Pt states he popped a pustule associated with the spot on his anterior shin, and it has become progressively more painful since. Pt does building maintenance for work. Pt is a smoker.   Pt would also like to restart his Zeita prescription, and would like Dr. Raul Del guidance as he begins that medication.   . Pt was admitted to ER May, 2015 for an episode of syncope.    Review of Systems  Constitutional: Negative for fever and chills.  HENT: Positive for congestion.   Musculoskeletal: Positive for myalgias.  Skin: Positive for color change and wound.       Objective:  BP 124/80  Pulse 76  Temp(Src) 98.1 F (36.7 C) (Oral)  Resp 16   Wt 219 lb (99.338 kg)  SpO2 98%  Physical Exam  Nursing note and vitals reviewed. Constitutional: He is oriented to person, place, and time. He appears well-developed and well-nourished.  HENT:  Head: Normocephalic and atraumatic.  Eyes: Pupils are equal, round, and reactive to light.  Neck: No JVD present.  Cardiovascular: Normal rate and regular rhythm.   Pulmonary/Chest: Effort normal and breath sounds normal. No respiratory distress.  Musculoskeletal: He exhibits edema and tenderness.  Neurological: He is alert and oriented to person, place, and time.  Skin: Skin is warm and dry. There is erythema.  6 inch diameter area with tenderness, warmth and swelling. Central fluctuance immediately under 61mm scabbed papule.   Psychiatric: He has a normal mood and affect. His behavior is normal.       Assessment & Plan:    Smoker - Encouraged cessation.  Chronic kidney disease, unspecified stage  Hyperlipidemia - Had a fasting lipid panel done 5 months ago. Cholesterol at the time of ER visit was 230 with an LDL 163. With last lipid panel pt has a 10 year cardiac risk of 13.5%. Will restart cholesterol medication after acute infection has resolved. Pt is interested in taking Zetia  due to severe myalgias on  Statin (I think atorvastatin) though pt would be open to trying a different statin medication which is what we will do due to better mortality data.  Pt plans to start coming to see [place] more reg for primary medical care.  Cellulitis and abscess of leg - Plan: Wound culture, POCT CBC - Recheck in 24 hrs due to severity of swelling. S/p small I&D today  Meds ordered this encounter  Medications  . doxycycline (VIBRAMYCIN) 100 MG capsule    Sig: Take 1 capsule (100 mg total) by mouth 2 (two) times daily.    Dispense:  20 capsule    Refill:  0  . HYDROcodone-acetaminophen (NORCO/VICODIN) 5-325 MG per tablet    Sig: Take 1 tablet by mouth every 6 (six) hours as needed for moderate pain.     Dispense:  40 tablet    Refill:  0    I personally performed the services described in this documentation, which was scribed in my presence. The recorded information has been reviewed and considered, and addended by me as needed.  Delman Cheadle, MD MPH

## 2014-10-20 NOTE — Progress Notes (Signed)
Procedure: Verbal consent obtained. Skin was cleaned with alcohol. Skin was anesthetized with 1% lido with epi. A 0.5 cm incision was created with a #11 blade. A small amount of purulent material was expressed. Wound was packed. Area of erythema and area of induration were outlined in marker. A dressing was placed. Wound care discussed.

## 2014-10-20 NOTE — Patient Instructions (Signed)
We will start cholesterol medication on you after this acute infection has resolved.  Abscess Care After An abscess (also called a boil or furuncle) is an infected area that contains a collection of pus. Signs and symptoms of an abscess include pain, tenderness, redness, or hardness, or you may feel a moveable soft area under your skin. An abscess can occur anywhere in the body. The infection may spread to surrounding tissues causing cellulitis. A cut (incision) by the surgeon was made over your abscess and the pus was drained out. Gauze may have been packed into the space to provide a drain that will allow the cavity to heal from the inside outwards. The boil may be painful for 5 to 7 days. Most people with a boil do not have high fevers. Your abscess, if seen early, may not have localized, and may not have been lanced. If not, another appointment may be required for this if it does not get better on its own or with medications. HOME CARE INSTRUCTIONS   Only take over-the-counter or prescription medicines for pain, discomfort, or fever as directed by your caregiver.  When you bathe, soak and then remove gauze or iodoform packs at least daily or as directed by your caregiver. You may then wash the wound gently with mild soapy water. Repack with gauze or do as your caregiver directs. SEEK IMMEDIATE MEDICAL CARE IF:   You develop increased pain, swelling, redness, drainage, or bleeding in the wound site.  You develop signs of generalized infection including muscle aches, chills, fever, or a general ill feeling.  An oral temperature above 102 F (38.9 C) develops, not controlled by medication. See your caregiver for a recheck if you develop any of the symptoms described above. If medications (antibiotics) were prescribed, take them as directed. Document Released: 06/29/2005 Document Revised: 03/04/2012 Document Reviewed: 02/24/2008 [facility] Patient Information 2015 [place], [place]. This information  is not intended to replace advice given to you by your health care provider. Make sure you discuss any questions you have with your health care provider.  Abscess An abscess is an infected area that contains a collection of pus and debris.It can occur in almost any part of the body. An abscess is also known as a furuncle or boil. CAUSES  An abscess occurs when tissue gets infected. This can occur from blockage of oil or sweat glands, infection of hair follicles, or a minor injury to the skin. As the body tries to fight the infection, pus collects in the area and creates pressure under the skin. This pressure causes pain. People with weakened immune systems have difficulty fighting infections and get certain abscesses more often.  SYMPTOMS Usually an abscess develops on the skin and becomes a painful mass that is red, warm, and tender. If the abscess forms under the skin, you may feel a moveable soft area under the skin. Some abscesses break open (rupture) on their own, but most will continue to get worse without care. The infection can spread deeper into the body and eventually into the bloodstream, causing you to feel ill.  DIAGNOSIS  Your caregiver will take your medical history and perform a physical exam. A sample of fluid may also be taken from the abscess to determine what is causing your infection. TREATMENT  Your caregiver may prescribe antibiotic medicines to fight the infection. However, taking antibiotics alone usually does not cure an abscess. Your caregiver may need to make a small cut (incision) in the abscess to drain the pus.  In some cases, gauze is packed into the abscess to reduce pain and to continue draining the area. HOME CARE INSTRUCTIONS   Only take over-the-counter or prescription medicines for pain, discomfort, or fever as directed by your caregiver.  If you were prescribed antibiotics, take them as directed. Finish them even if you start to feel better.  If gauze is used,  follow your caregiver's directions for changing the gauze.  To avoid spreading the infection:  Keep your draining abscess covered with a bandage.  Wash your hands well.  Do not share personal care items, towels, or whirlpools with others.  Avoid skin contact with others.  Keep your skin and clothes clean around the abscess.  Keep all follow-up appointments as directed by your caregiver. SEEK MEDICAL CARE IF:   You have increased pain, swelling, redness, fluid drainage, or bleeding.  You have muscle aches, chills, or a general ill feeling.  You have a fever. MAKE SURE YOU:   Understand these instructions.  Will watch your condition.  Will get help right away if you are not doing well or get worse. Document Released: 09/20/2005 Document Revised: 06/11/2012 Document Reviewed: 02/23/2012 [facility] Patient Information 2015 [place], [place]. This information is not intended to replace advice given to you by your health care provider. Make sure you discuss any questions you have with your health care provider.   Cellulitis Cellulitis is an infection of the skin and the tissue beneath it. The infected area is usually red and tender. Cellulitis occurs most often in the arms and lower legs.  CAUSES  Cellulitis is caused by bacteria that enter the skin through cracks or cuts in the skin. The most common types of bacteria that cause cellulitis are staphylococci and streptococci. SIGNS AND SYMPTOMS   Redness and warmth.  Swelling.  Tenderness or pain.  Fever. DIAGNOSIS  Your health care provider can usually determine what is wrong based on a physical exam. Blood tests may also be done. TREATMENT  Treatment usually involves taking an antibiotic medicine. HOME CARE INSTRUCTIONS   Take your antibiotic medicine as directed by your health care provider. Finish the antibiotic even if you start to feel better.  Keep the infected arm or leg elevated to reduce swelling.  Apply a warm  cloth to the affected area up to 4 times per day to relieve pain.  Take medicines only as directed by your health care provider.  Keep all follow-up visits as directed by your health care provider. SEEK MEDICAL CARE IF:   You notice red streaks coming from the infected area.  Your red area gets larger or turns dark in color.  Your bone or joint underneath the infected area becomes painful after the skin has healed.  Your infection returns in the same area or another area.  You notice a swollen bump in the infected area.  You develop new symptoms.  You have a fever. SEEK IMMEDIATE MEDICAL CARE IF:   You feel very sleepy.  You develop vomiting or diarrhea.  You have a general ill feeling (malaise) with muscle aches and pains. MAKE SURE YOU:   Understand these instructions.  Will watch your condition.  Will get help right away if you are not doing well or get worse. Document Released: 09/20/2005 Document Revised: 04/27/2014 Document Reviewed: 02/26/2012 [facility] Patient Information 2015 [place], [place]. This information is not intended to replace advice given to you by your health care provider. Make sure you discuss any questions you have with your health care  provider. Food Choices to Lower Your Triglycerides  Triglycerides are a type of fat in your blood. High levels of triglycerides can increase the risk of heart disease and stroke. If your triglyceride levels are high, the foods you eat and your eating habits are very important. Choosing the right foods can help lower your triglycerides.  WHAT GENERAL GUIDELINES DO I NEED TO FOLLOW?  Lose weight if you are overweight.   Limit or avoid alcohol.   Fill one half of your plate with vegetables and green salads.   Limit fruit to two servings a day. Choose fruit instead of juice.   Make one fourth of your plate whole grains. Look for the word "whole" as the first word in the ingredient list.  Fill one fourth of your  plate with lean protein foods.  Enjoy fatty fish (such as salmon, mackerel, sardines, and tuna) three times a week.   Choose healthy fats.   Limit foods high in starch and sugar.  Eat more home-cooked food and less restaurant, buffet, and fast food.  Limit fried foods.  Cook foods using methods other than frying.  Limit saturated fats.  Check ingredient lists to avoid foods with partially hydrogenated oils (trans fats) in them. WHAT FOODS CAN I EAT?  Grains Whole grains, such as whole wheat or whole grain breads, crackers, cereals, and pasta. Unsweetened oatmeal, bulgur, barley, quinoa, or brown rice. Corn or whole wheat flour tortillas.  Vegetables Fresh or frozen vegetables (raw, steamed, roasted, or grilled). Green salads. Fruits All fresh, canned (in natural juice), or frozen fruits. Meat and Other Protein Products Ground beef (85% or leaner), grass-fed beef, or beef trimmed of fat. Skinless chicken or [place]. Ground chicken or [place]. Pork trimmed of fat. All fish and seafood. Eggs. Dried beans, peas, or lentils. Unsalted nuts or seeds. Unsalted canned or dry beans. Dairy Low-fat dairy products, such as skim or 1% milk, 2% or reduced-fat cheeses, low-fat ricotta or cottage cheese, or plain low-fat yogurt. Fats and Oils Tub margarines without trans fats. Light or reduced-fat mayonnaise and salad dressings. Avocado. Safflower, olive, or canola oils. Natural peanut or almond butter. The items listed above may not be a complete list of recommended foods or beverages. Contact your dietitian for more options. WHAT FOODS ARE NOT RECOMMENDED?  Grains White bread. White pasta. White rice. Cornbread. Bagels, pastries, and croissants. Crackers that contain trans fat. Vegetables White potatoes. Corn. Creamed or fried vegetables. Vegetables in a cheese sauce. Fruits Dried fruits. Canned fruit in light or heavy syrup. Fruit juice. Meat and Other Protein Products Fatty cuts of meat.  Ribs, chicken wings, bacon, sausage, bologna, salami, chitterlings, fatback, hot dogs, bratwurst, and packaged luncheon meats. Dairy Whole or 2% milk, cream, half-and-half, and cream cheese. Whole-fat or sweetened yogurt. Full-fat cheeses. Nondairy creamers and whipped toppings. Processed cheese, cheese spreads, or cheese curds. Sweets and Desserts Corn syrup, sugars, honey, and molasses. Candy. Jam and jelly. Syrup. Sweetened cereals. Cookies, pies, cakes, donuts, muffins, and ice cream. Fats and Oils Butter, stick margarine, lard, shortening, ghee, or bacon fat. Coconut, palm kernel, or palm oils. Beverages Alcohol. Sweetened drinks (such as sodas, lemonade, and fruit drinks or punches). The items listed above may not be a complete list of foods and beverages to avoid. Contact your dietitian for more information. Document Released: 09/28/2004 Document Revised: 12/16/2013 Document Reviewed: 10/15/2013 [facility] Patient Information 2015 [place], [place]. This information is not intended to replace advice given to you by your health care provider. Make sure  you discuss any questions you have with your health care provider.

## 2014-10-21 ENCOUNTER — Ambulatory Visit (INDEPENDENT_AMBULATORY_CARE_PROVIDER_SITE_OTHER): Payer: BC Managed Care – PPO | Admitting: Family Medicine

## 2014-10-21 VITALS — BP 120/82 | HR 112 | Temp 98.7°F | Resp 18 | Ht 71.0 in | Wt 219.0 lb

## 2014-10-21 DIAGNOSIS — L02419 Cutaneous abscess of limb, unspecified: Secondary | ICD-10-CM

## 2014-10-21 DIAGNOSIS — L03119 Cellulitis of unspecified part of limb: Secondary | ICD-10-CM

## 2014-10-21 NOTE — Progress Notes (Signed)
   Subjective:    Patient ID: Ivan Allen, male    DOB: 07/29/1961, 54 y.o.   MRN: [id number] Chief Complaint  Patient presents with  . Follow-up    HPI  Has been doing well. Has had a small amount of purulent and sanguinous drainage but not significant. Pain improving a little. Not feeling sick and no sig fevers or myalgias but did have some cold sweats last night - really wasn't able to sleep at all. Tolerating the doxycycline.  Past Medical History  Diagnosis Date  . Depression   . Anxiety    Current Outpatient Prescriptions on File Prior to Visit  Medication Sig Dispense Refill  . aspirin EC 81 MG tablet Take 81 mg by mouth at bedtime.      Marland Kitchen doxycycline (VIBRAMYCIN) 100 MG capsule Take 1 capsule (100 mg total) by mouth 2 (two) times daily.  20 capsule  0  . escitalopram (LEXAPRO) 10 MG tablet Take 20 mg by mouth at bedtime.       Marland Kitchen HYDROcodone-acetaminophen (NORCO/VICODIN) 5-325 MG per tablet Take 1 tablet by mouth every 6 (six) hours as needed for moderate pain.  40 tablet  0  . lamoTRIgine (LAMICTAL) 200 MG tablet Take 200 mg by mouth 2 (two) times daily.       Marland Kitchen OVER THE COUNTER MEDICATION Take 1 tablet by mouth 2 (two) times daily before lunch and supper. Equate acid reducer      . ezetimibe (ZETIA) 10 MG tablet Take 1 tablet (10 mg total) by mouth daily.  30 tablet  0   No current facility-administered medications on file prior to visit.   No Known Allergies   Review of Systems  Constitutional: Positive for chills and diaphoresis. Negative for fever, activity change and appetite change.  Cardiovascular: Negative for leg swelling.  Gastrointestinal: Negative for nausea, vomiting, abdominal pain, diarrhea and constipation.  Musculoskeletal: Positive for myalgias. Negative for gait problem and joint swelling.  Skin: Positive for rash and wound.  Neurological: Negative for weakness and numbness.  Hematological: Negative for adenopathy. Does not bruise/bleed easily.    Psychiatric/Behavioral: Positive for sleep disturbance. Negative for dysphoric mood. The patient is not nervous/anxious.        Objective:  BP 120/82  Pulse 112  Temp(Src) 98.7 F (37.1 C) (Oral)  Resp 18  Ht 5\' 11"  (1.803 m)  Wt 219 lb (99.338 kg)  BMI 30.56 kg/m2  SpO2 97%  Physical Exam  Constitutional: He is oriented to person, place, and time. He appears well-developed and well-nourished. No distress.  HENT:  Head: Normocephalic and atraumatic.  Eyes: No scleral icterus.  Pulmonary/Chest: Effort normal.  Neurological: He is alert and oriented to person, place, and time.  Skin: Skin is warm and dry. Rash noted. Rash is macular. He is not diaphoretic.     Mild erythema and warmth within the circular marker but fluctuance extending several inches laterally from incision and packing site. Bright erythema around incision site extending outside of marked line. Small amount of purulent-sanguinous drainge noted.  Psychiatric: He has a normal mood and affect. His behavior is normal.          Assessment & Plan:  Cellulitis and abscess of leg S/p further I&D today - recheck tomorrow as seems to be rapidly progressing, if sxs cont or not draining, consider soft tissue US to see if any pockets of infxn can be visualized   Delman Cheadle, MD MPH

## 2014-10-21 NOTE — Progress Notes (Signed)
Procedure: small amount of purulent material was expressed from the wound. Eschar was removed. Wound was irrigated with 5 cc 1% lidocaine. Wound was explored with a curved hemostat and a superolateral pocket discovered. Wound was packed and dressed. Wound care discussed.

## 2014-10-22 ENCOUNTER — Ambulatory Visit (INDEPENDENT_AMBULATORY_CARE_PROVIDER_SITE_OTHER): Payer: BC Managed Care – PPO | Admitting: Physician Assistant

## 2014-10-22 VITALS — BP 122/82 | HR 115 | Temp 98.7°F | Resp 16 | Ht 71.0 in | Wt 213.8 lb

## 2014-10-22 DIAGNOSIS — L03119 Cellulitis of unspecified part of limb: Secondary | ICD-10-CM

## 2014-10-22 DIAGNOSIS — L02419 Cutaneous abscess of limb, unspecified: Secondary | ICD-10-CM

## 2014-10-22 NOTE — Patient Instructions (Signed)
Continue the antibiotic as prescribed. Apply a warm compress to the area for 15-20 minutes 2-4 times each day, more is ok. Change the dressing if it becomes saturated, leaks or falls off. Keep the leg elevated whenever possible.

## 2014-10-22 NOTE — Progress Notes (Signed)
   Subjective:    Patient ID: Ivan Allen, male    DOB: 10-Nov-1961, 53 y.o.   MRN: [id number]   PCP: Jenny Reichmann, MD  Chief Complaint  Patient presents with  . Follow-up    HPI  Patient presents for wound care, s/p I&D of cellulitis/abscess of the RIGHT antero-lateral tibia on 10/20/2014. He's had a lot of swelling of the lower leg, and redness, which prompted consideration of a LE doppler US.  However, with expression of additional purulence and ability to pack into a tunnel yesterday, we elected to monitor closely. He spent yesterday with the leg elevated and with a heating pad on it. He notes that it feels better, and he thinks the erythema and swelling is some better.  The pain is improved as well.  He continues to tolerate the doxycycline. Preliminary wound culture reveals abundant Staphlyococcus aureus.  Review of Systems     Objective:   Physical Exam  BP 122/82  Pulse 115  Temp(Src) 98.7 F (37.1 C) (Oral)  Resp 16  Ht 5\' 11"  (1.803 m)  Wt 213 lb 12.8 oz (96.979 kg)  BMI 29.83 kg/m2  SpO2 95% WDWNWM, A&O x 3.  Dressing and packing removed.  Minimal drainage on the dressing.  Erythema is diffuse, and very mild with the exception of central darker erythema 2-3 cm surrounding the wound opening.  Minimal induration. The leg remains edematous, 1+ pitting, but is tender only immediately surrounding the wound opening. No additional purulence can be expressed.  Irrigated wound cavity with 3 cc 2% xylocaine plain. Packed with 1/4 inch plain packing and dressed.     Assessment & Plan:  1. Cellulitis and abscess of leg Continue current treatment. RTC 48 hours, sooner if symptoms worsen.  Fara Chute, PA-C Certified Physician [place] [facility]

## 2014-10-23 LAB — WOUND CULTURE
GRAM STAIN: NONE SEEN
Gram Stain: NONE SEEN

## 2014-10-24 ENCOUNTER — Ambulatory Visit (INDEPENDENT_AMBULATORY_CARE_PROVIDER_SITE_OTHER): Payer: BC Managed Care – PPO | Admitting: Physician Assistant

## 2014-10-24 VITALS — BP 116/70 | HR 91 | Temp 98.1°F | Resp 18 | Ht 71.0 in | Wt 214.8 lb

## 2014-10-24 DIAGNOSIS — L02419 Cutaneous abscess of limb, unspecified: Secondary | ICD-10-CM

## 2014-10-24 DIAGNOSIS — L03119 Cellulitis of unspecified part of limb: Secondary | ICD-10-CM

## 2014-10-24 NOTE — Progress Notes (Signed)
   Subjective:    Patient ID: Ivan Allen, male    DOB: 1961/07/17, 53 y.o.   MRN: [id number]   PCP: Jenny Reichmann, MD  Chief Complaint  Patient presents with  . Wound Check    on Rt Leg    HPI  Presents for wound care, s/p I&D of an abscess on the RIGHT anterolateral lower leg on 10/27.  Overall, he's doing much better. Last night he developed increased pain, more than he's had since the initial visit here.  He removed the bandage and soaked in the bath for a while before going to bed.  This morning he feels much, much better.  He still has swelling of the lower leg, but the diffuse redness is resolved. There is still a little redness immediately surrounding the site. The pain is "gone."  Continues to tolerate doxycycline. No fever, chills, GI or GU complaints. He's using a warm compress to the area and staying off the leg as much as possible.  Review of Systems     Objective:   Physical Exam  BP 116/70  Pulse 91  Temp(Src) 98.1 F (36.7 C) (Oral)  Resp 18  Ht 5\' 11"  (1.803 m)  Wt 214 lb 12.8 oz (97.433 kg)  BMI 29.97 kg/m2  SpO2 98% WDWNWM, A&O x 3.  Dressing and packing removed.   No additional purulence. Wound bed is beefy red and bloody. Minimal erythema, only at the site. No induration. Swelling of the lower leg is some improved.  Irrigated with 3 cc 1% lidocaine plain. Gently packed with 1/4 inch plain packing. Dressed.    Assessment & Plan:  1. Cellulitis and abscess of leg Continue antibiotic, warm compresses and leg elevation. RTC 2 days.   Fara Chute, PA-C Physician Assistant-Certified [facility]

## 2014-10-26 ENCOUNTER — Ambulatory Visit (INDEPENDENT_AMBULATORY_CARE_PROVIDER_SITE_OTHER): Payer: BC Managed Care – PPO | Admitting: Physician Assistant

## 2014-10-26 VITALS — BP 128/70 | HR 83 | Temp 98.6°F | Resp 16 | Ht 71.0 in | Wt 214.0 lb

## 2014-10-26 DIAGNOSIS — L03119 Cellulitis of unspecified part of limb: Secondary | ICD-10-CM

## 2014-10-26 DIAGNOSIS — L02419 Cutaneous abscess of limb, unspecified: Secondary | ICD-10-CM

## 2014-10-26 NOTE — Patient Instructions (Signed)
Continue with the warm compresses and complete the antibiotic.

## 2014-10-26 NOTE — Progress Notes (Signed)
   Subjective:    Patient ID: Ivan Allen, male    DOB: March 10, 1961, 53 y.o.   MRN: [id number]   PCP: Jenny Reichmann, MD  Chief Complaint  Patient presents with  . Wound Check    HPI  Presents for wound care, s/p I&D of abscess on the RIGHT lower leg. See previous notes. Continues to improve, less swelling, redness and pain. Continues to tolerate doxycycline. No taking hydrocodone. No fever/chills.  When changing the dressing yesterday, the packing came out.  Review of Systems     Objective:   Physical Exam  BP 128/70 mmHg  Pulse 83  Temp(Src) 98.6 F (37 C) (Oral)  Resp 16  Ht 5\' 11"  (1.803 m)  Wt 214 lb (97.07 kg)  BMI 29.86 kg/m2  SpO2 97% WDWNWM, A&O x 3. Reduced but persistent edema of the RIGHT LE. Wound is closed. Minimal erythema. No induration.       Assessment & Plan:  1. Cellulitis and abscess of leg Return in 2 days for recheck, sooner if symptoms worsen tomorrow.   Fara Chute, PA-C Physician Assistant-Certified [facility]

## 2014-10-28 ENCOUNTER — Ambulatory Visit: Payer: BC Managed Care – PPO | Admitting: Physician Assistant

## 2014-10-28 ENCOUNTER — Ambulatory Visit (INDEPENDENT_AMBULATORY_CARE_PROVIDER_SITE_OTHER): Payer: BC Managed Care – PPO

## 2014-10-28 DIAGNOSIS — L02419 Cutaneous abscess of limb, unspecified: Secondary | ICD-10-CM

## 2014-10-28 DIAGNOSIS — L03119 Cellulitis of unspecified part of limb: Principal | ICD-10-CM

## 2014-10-28 DIAGNOSIS — Z23 Encounter for immunization: Secondary | ICD-10-CM

## 2014-10-28 NOTE — Progress Notes (Signed)
   Subjective:    Patient ID: Ivan Allen, male    DOB: 1961/10/25, 53 y.o.   MRN: [id number]   PCP: Jenny Reichmann, MD  Chief Complaint  Patient presents with  . Wound Check    HPI  Presents for wound check. I&D of an abscess on the upper anterolateral lower leg on 10/20/2014. He initially had considerable swelling and erythema, larger than expected for abscess size. He's doing well on Doxycycline and has continued to improve at each visit. When I saw him 2 days ago, the packing had come out and we elected not to repack, but to recheck today to make sure things were continuing to improve.  He's done well, continuing to have less swelling. Pain resolved until he bumped the site today at work, but that pain has now also resolved. No fever, chills, GI or GU symptoms.  Review of Systems As above.    Objective:   Physical Exam WDWNWM, A&O x 3.  Wound is healing well. No erythema or induration. Non-tender. 1+ swelling of the RIGHT lower leg.       Assessment & Plan:  1. Cellulitis and abscess of leg Resolving. Complete antibiotic. RTC if pain, swelling or erythema recur/worsen.  2. Need for influenza vaccination - Flu Vaccine QUAD 36+ mos IM  Schedule wellness visit/establish primary care with Dr. Delman Cheadle as convenient.  Fara Chute, PA-C Physician Assistant-Certified [facility]

## 2014-10-28 NOTE — Progress Notes (Signed)
Patient brought to treatment area.  He returns today for a recheck of right lower leg wound.  Wound dressing removed.  Notified patient that he would be seen by Harrison Mons, PA-C.

## 2015-09-18 ENCOUNTER — Ambulatory Visit (INDEPENDENT_AMBULATORY_CARE_PROVIDER_SITE_OTHER): Payer: BC Managed Care – PPO | Admitting: Physician Assistant

## 2015-09-18 VITALS — BP 122/72 | HR 92 | Temp 98.2°F | Resp 17 | Ht 70.0 in | Wt 217.0 lb

## 2015-09-18 DIAGNOSIS — Z760 Encounter for issue of repeat prescription: Secondary | ICD-10-CM | POA: Diagnosis not present

## 2015-09-18 DIAGNOSIS — Z23 Encounter for immunization: Secondary | ICD-10-CM

## 2015-09-18 DIAGNOSIS — Z0000 Encounter for general adult medical examination without abnormal findings: Secondary | ICD-10-CM | POA: Diagnosis not present

## 2015-09-18 DIAGNOSIS — Z139 Encounter for screening, unspecified: Secondary | ICD-10-CM | POA: Diagnosis not present

## 2015-09-18 DIAGNOSIS — Z1211 Encounter for screening for malignant neoplasm of colon: Secondary | ICD-10-CM | POA: Diagnosis not present

## 2015-09-18 LAB — COMPLETE METABOLIC PANEL WITH GFR
ALT: 21 U/L (ref 9–46)
AST: 16 U/L (ref 10–35)
Albumin: 4.6 g/dL (ref 3.6–5.1)
Alkaline Phosphatase: 80 U/L (ref 40–115)
BILIRUBIN TOTAL: 0.4 mg/dL (ref 0.2–1.2)
BUN: 18 mg/dL (ref 7–25)
CALCIUM: 9.3 mg/dL (ref 8.6–10.3)
CO2: 29 mmol/L (ref 20–31)
CREATININE: 0.85 mg/dL (ref 0.70–1.33)
Chloride: 102 mmol/L (ref 98–110)
GFR, Est Non African American: >89 mL/min (ref >=60)
Glucose, Bld: 86 mg/dL (ref 65–99)
Potassium: 4.7 mmol/L (ref 3.5–5.3)
Sodium: 135 mmol/L (ref 135–146)
TOTAL PROTEIN: 7 g/dL (ref 6.1–8.1)

## 2015-09-18 LAB — CBC
HEMATOCRIT: 47.3 % (ref 39.0–52.0)
Hemoglobin: 16.5 g/dL (ref 13.0–17.0)
MCH: 32.7 pg (ref 26.0–34.0)
MCHC: 34.9 g/dL (ref 30.0–36.0)
MCV: 93.7 fL (ref 78.0–100.0)
MPV: 9.3 fL (ref 8.6–12.4)
Platelets: 248 10*3/uL (ref 150–400)
RBC: 5.05 MIL/uL (ref 4.22–5.81)
RDW: 13.5 % (ref 11.5–15.5)
WBC: 8.2 10*3/uL (ref 4.0–10.5)

## 2015-09-18 LAB — POCT URINALYSIS DIP (MANUAL ENTRY)
BILIRUBIN UA: NEGATIVE
BILIRUBIN UA: NEGATIVE
Blood, UA: NEGATIVE
Glucose, UA: NEGATIVE
Leukocytes, UA: NEGATIVE
Nitrite, UA: NEGATIVE
PROTEIN UA: NEGATIVE
SPEC GRAV UA: 1.02
Urobilinogen, UA: 0.2
pH, UA: 7

## 2015-09-18 LAB — POCT GLYCOSYLATED HEMOGLOBIN (HGB A1C): Hemoglobin A1C: 5.2

## 2015-09-18 LAB — RPR

## 2015-09-18 LAB — TSH: TSH: 1.575 u[IU]/mL (ref 0.350–4.500)

## 2015-09-18 LAB — HIV ANTIBODY (ROUTINE TESTING W REFLEX): HIV 1&2 Ab, 4th Generation: NONREACTIVE

## 2015-09-18 MED ORDER — EZETIMIBE 10 MG PO TABS: 10.0000 mg | ORAL_TABLET | Freq: Every day | ORAL | Status: DC

## 2015-09-18 NOTE — Patient Instructions (Addendum)
1-800-[employer]  Exercise improves every system in the body.  It lowers the risk of heart disease, decreases blood pressure, reduces the symptoms of depression and anxiety, and lowers blood sugar. To receive these benefits, try to get 150 minutes of planned exercise each week.  You can break this 150 minutes up however you like.  For instance, you can perform 30 minutes of brisk walking 5 days a week, or perform 50 minutes 3 days a week.  If you don't like walking, or can't find a safe place to walk, find another way to move that you can enjoy.  Exercise tapes, cycling, stair climbing, swimming, or a combination will be just as good as a walking program. To ensure the proper intensity, you can use the talk test. Essentially, you should be able to carry on a conversation, but you should have to take short breaks from the conversation in order catch your breath.   Your target body weight is 190-195.

## 2015-09-18 NOTE — Progress Notes (Signed)
09/18/2015 at 6:56 PM  Ivan Allen / DOB: 1961/10/15 / MRN: [id number]  The patient has Amphetamine adverse reaction; Smoker; Chronic kidney disease; and Hyperlipidemia on his problem list.  SUBJECTIVE  Ivan Allen is a 54 y.o. well appearing male presenting for the chief complaint of annual physical. He is smoker with 38 pack year history. He is interested in quitting but does not have a plan.  He has tried Wellbutin in the past and reports it made him have anxiety. Has not tried chantix.   Has had dyslipidemia in the past.  ASCVD risk 14.4. He has not had a colonoscopy. He has not had the yearly flu shot. He has had a tetanus shot in the last ten years.  H has a history of depression controlled by medication and counseling.  He takes ASA daily. H    Immunization History  Administered Date(s) Administered  . Influenza,inj,Quad PF,36+ Mos 10/28/2014, 09/18/2015    He  has a past medical history of Depression and Anxiety.    Medications reviewed and updated by myself where necessary, and exist elsewhere in the encounter.   Ivan Allen has No Known Allergies. He  reports that he has been smoking Cigarettes.  He has a 37 pack-year smoking history. He does not have any smokeless tobacco history on file. He reports that he does not drink alcohol or use illicit drugs. He  has no sexual activity history on file. The patient  has past surgical history that includes Tonsillectomy.  His family history includes Hypertension in his father and mother.  Review of Systems  Constitutional: Negative.   HENT: Negative.   Eyes: Negative.   Respiratory: Negative.   Cardiovascular: Negative.   Gastrointestinal: Negative for nausea and abdominal pain.  Genitourinary: Negative for dysuria, urgency and frequency.  Skin: Negative.   Neurological: Negative for dizziness.    OBJECTIVE  His  height is 5\' 10"  (1.778 m) and weight is 217 lb (98.431 kg). His oral temperature is 98.2 F (36.8 C). His blood  pressure is 122/72 and his pulse is 92. His respiration is 17 and oxygen saturation is 98%.  The patient's body mass index is 31.14 kg/(m^2).  Physical Exam  Vitals reviewed. Constitutional: He is oriented to person, place, and time. He appears well-developed. No distress.  Eyes: EOM are normal. Pupils are equal, round, and reactive to light. No scleral icterus.  Neck: Normal range of motion.  Cardiovascular: Normal rate and regular rhythm.   Respiratory: Effort normal and breath sounds normal.  GI: He exhibits no distension.  Musculoskeletal: Normal range of motion.  Neurological: He is alert and oriented to person, place, and time. No cranial nerve deficit.  Skin: Skin is warm and dry. No rash noted. He is not diaphoretic.  Psychiatric: He has a normal mood and affect.    Results for orders placed or performed in visit on 09/18/15 (from the past 24 hour(s))  HIV antibody (with reflex)     Status: None (Preliminary result)   Collection Time: 09/18/15  9:37 AM  Result Value Ref Range   HIV 1&2 Ab, 4th Generation  NONREACTIVE   Narrative   Performed at:  [address], [address]                [place], [place] [address]  RPR     Status: None   Collection Time: 09/18/15  9:37  AM  Result Value Ref Range   RPR Ser Ql NON REAC NON REAC   Narrative   Performed at:  [address], [address]                [place], [place] [address]  CBC     Status: None   Collection Time: 09/18/15  9:37 AM  Result Value Ref Range   WBC 8.2 4.0 - 10.5 K/uL   RBC 5.05 4.22 - 5.81 MIL/uL   Hemoglobin 16.5 13.0 - 17.0 g/dL   HCT 47.3 39.0 - 52.0 %   MCV 93.7 78.0 - 100.0 fL   MCH 32.7 26.0 - 34.0 pg   MCHC 34.9 30.0 - 36.0 g/dL   RDW 13.5 11.5 - 15.5 %   Platelets 248 150 - 400 K/uL   MPV 9.3 8.6 - 12.4 fL   Narrative   Performed at:  [address], [address]                [place],  [place] [address]  COMPLETE METABOLIC PANEL WITH GFR     Status: None   Collection Time: 09/18/15  9:37 AM  Result Value Ref Range   Sodium 135 135 - 146 mmol/L   Potassium 4.7 3.5 - 5.3 mmol/L   Chloride 102 98 - 110 mmol/L   CO2 29 20 - 31 mmol/L   Glucose, Bld 86 65 - 99 mg/dL   BUN 18 7 - 25 mg/dL   Creat 0.85 0.70 - 1.33 mg/dL   Total Bilirubin 0.4 0.2 - 1.2 mg/dL   Alkaline Phosphatase 80 40 - 115 U/L   AST 16 10 - 35 U/L   ALT 21 9 - 46 U/L   Total Protein 7.0 6.1 - 8.1 g/dL   Albumin 4.6 3.6 - 5.1 g/dL   Calcium 9.3 8.6 - 10.3 mg/dL   GFR, Est African American >89 >=60 mL/min   GFR, Est Non African American >89 >=60 mL/min   Narrative   Performed at:  [address], [address]                [place], [place] [address]  TSH     Status: None   Collection Time: 09/18/15  9:37 AM  Result Value Ref Range   TSH 1.575 0.350 - 4.500 uIU/mL   Narrative   Performed at:  [address], [address]                [place], [place] [address]  POCT urinalysis dipstick     Status: None   Collection Time: 09/18/15  9:37 AM  Result Value Ref Range   Color, UA yellow yellow   Clarity, UA clear clear   Glucose, UA negative negative   Bilirubin, UA negative negative   Ketones, POC UA negative negative   Spec Grav, UA 1.020    Blood, UA negative negative   pH, UA 7.0    Protein Ur, POC negative negative   Urobilinogen, UA 0.2    Nitrite, UA Negative Negative   Leukocytes, UA Negative Negative  POCT glycosylated hemoglobin (Hb A1C)     Status: None   Collection Time: 09/18/15  9:37 AM  Result Value Ref Range   Hemoglobin A1C 5.2     Depression screen PHQ 2/9 09/18/2015  Decreased Interest 0  Down, Depressed, Hopeless 0  PHQ - 2 Score 0      ASSESSMENT & PLAN  Ivan Allen was seen today for annual exam.  Diagnoses and all orders for this visit:  Annual physical exam  Need for influenza vaccination -     Cancel:  Flu Vaccine QUAD 36+ mos IM -     Flu Vaccine QUAD 36+ mos IM  Special screening for malignant neoplasms, colon -     Ambulatory referral to Gastroenterology  Screening -     HIV antibody (with reflex) -     RPR -     CBC -     COMPLETE METABOLIC PANEL WITH GFR -     TSH -     POCT urinalysis dipstick -     POCT glycosylated hemoglobin (Hb A1C)  Encounter for medication refill -     ezetimibe (ZETIA) 10 MG tablet; Take 1 tablet (10 mg total) by mouth daily.    The patient was advised to call or come back to clinic if he does not see an improvement in symptoms, or worsens with the above plan.   Philis Fendt, MHS, PA-C [facility] 09/18/2015 6:56 PM

## 2015-09-20 ENCOUNTER — Encounter: Payer: Self-pay | Admitting: Gastroenterology

## 2015-09-28 ENCOUNTER — Encounter: Payer: Self-pay | Admitting: Emergency Medicine

## 2015-10-29 ENCOUNTER — Ambulatory Visit (AMBULATORY_SURGERY_CENTER): Payer: Self-pay

## 2015-10-29 VITALS — Ht 71.0 in | Wt 220.0 lb

## 2015-10-29 DIAGNOSIS — Z1211 Encounter for screening for malignant neoplasm of colon: Secondary | ICD-10-CM

## 2015-10-29 MED ORDER — NA SULFATE-K SULFATE-MG SULF 17.5-3.13-1.6 GM/177ML PO SOLN: 1.0000 | Freq: Once | ORAL | Status: DC

## 2015-10-29 NOTE — Progress Notes (Signed)
No egg or soy allergies Not on home 02 No previous anesthesia complications Emmi video emailed to home email address. No diet or weight loss meds 

## 2015-11-02 ENCOUNTER — Encounter: Payer: Self-pay | Admitting: Gastroenterology

## 2015-11-12 ENCOUNTER — Encounter: Payer: Self-pay | Admitting: Gastroenterology

## 2016-03-02 IMAGING — MR MR HEAD W/O CM
9 of 11 series · 31 of 48 positions shown · non-contrast
Comparison: Head CT 05/04/2014

CLINICAL DATA: Episode of syncope and a questionable facial droop
on the right. Patient is now back to baseline. Evaluate for
stroke/TIA.

EXAM:
MRI HEAD WITHOUT CONTRAST
MRA HEAD WITHOUT CONTRAST
TECHNIQUE: Multiplanar, multiecho pulse sequences of the brain and surrounding
structures were obtained without intravenous contrast. Angiographic
images of the head were obtained using MRA technique without
contrast.

[Series 3: T1 · sagittal · 5.0mm · 0.47mm/px · 2 of 22 slices shown]
[im 1/22]
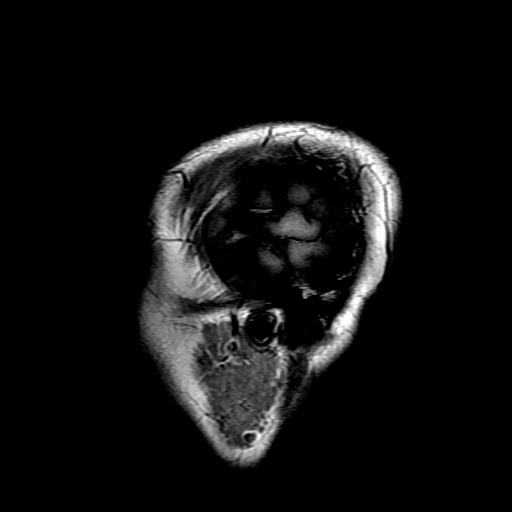
[im 22/22]
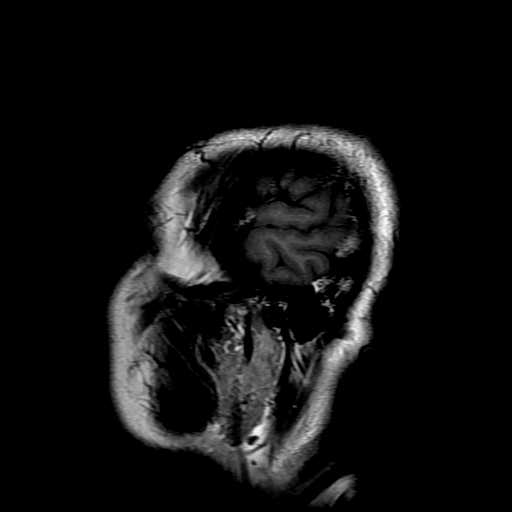

[Series 4: DWI · axial · 5.0mm · 1.09mm/px · z∈[-31,+113]mm · 5 of 60 slices shown (1 of 4)]
[im 1/60]
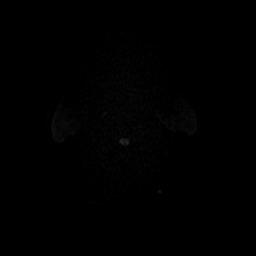
[im 15/60]
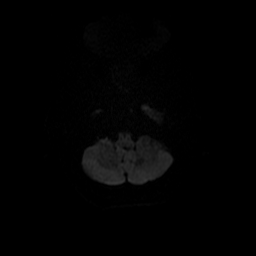
[im 30/60]
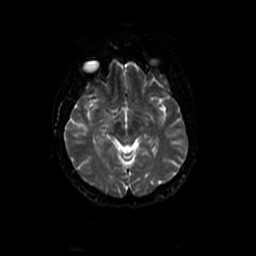
[im 45/60]
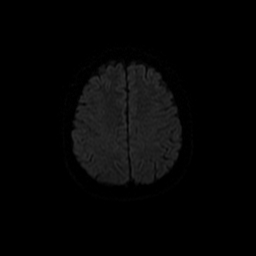
[im 60/60]
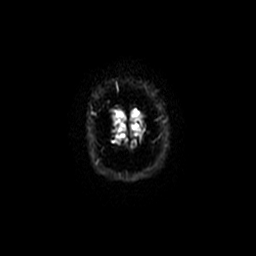

[Series 5: T2 · axial · 5.0mm · 0.43mm/px · z∈[-23,+108]mm · 2 of 23 slices shown (1 of 2)]
[im 1/23]
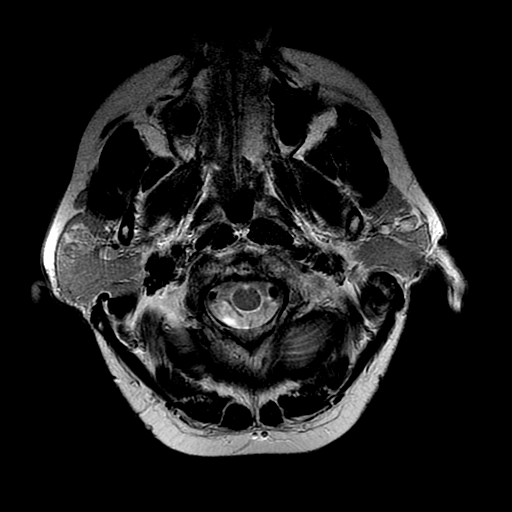
[im 23/23]
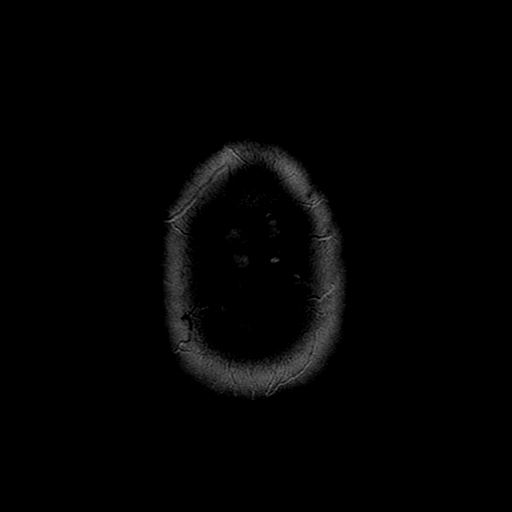

[Series 6: (id) mt fs · axial · 1.4mm · 0.43mm/px · z∈[-47,+47]mm · 7 of 160 slices shown]
[im 1/160]
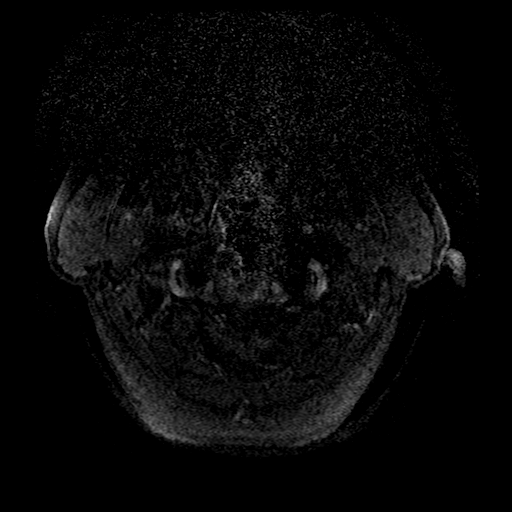
[im 25/160]
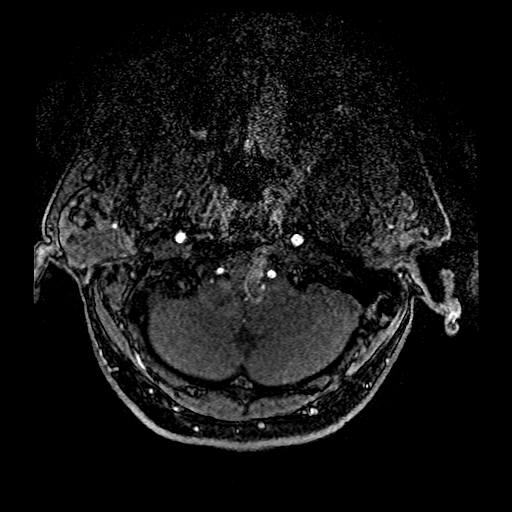
[im 49/160]
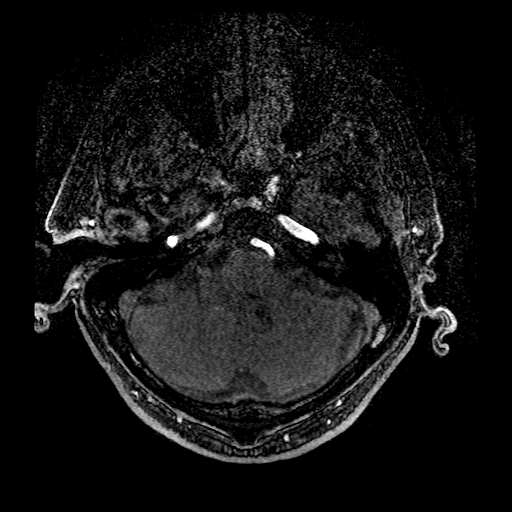
[im 74/160]
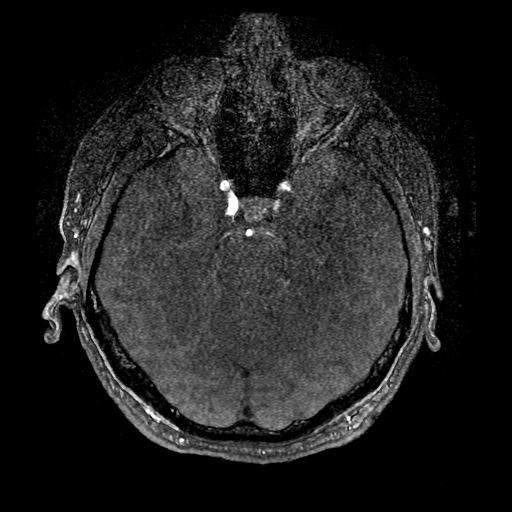
[im 86/160]
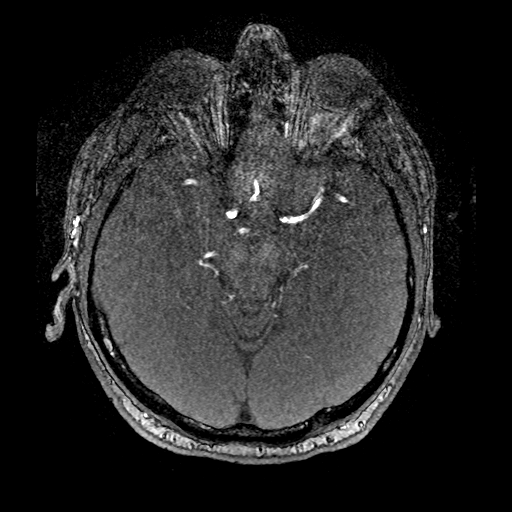
[im 111/160]
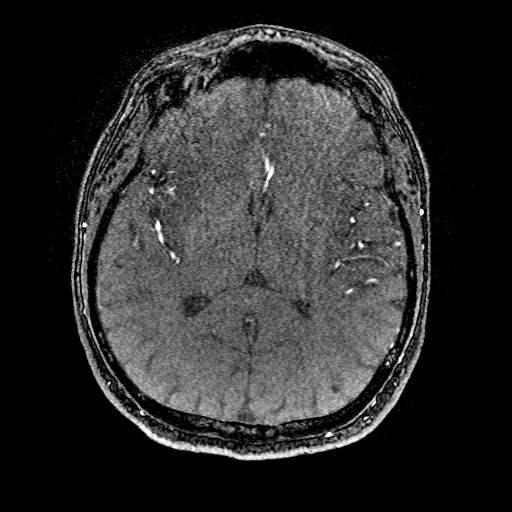
[im 135/160]
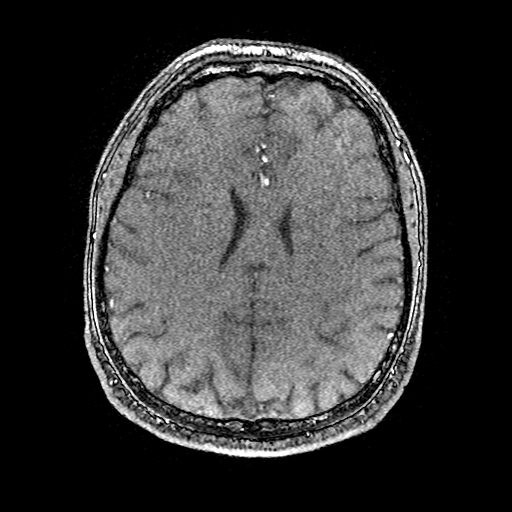

[Series 7: FLAIR · axial · 5.0mm · 0.43mm/px · z∈[-23,+108]mm · 2 of 23 slices shown]
[im 1/23]
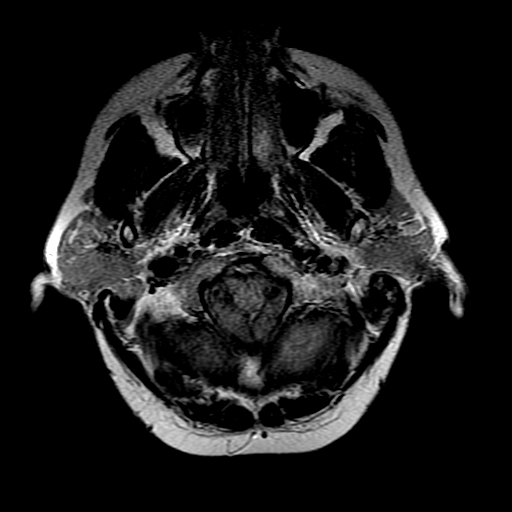
[im 23/23]
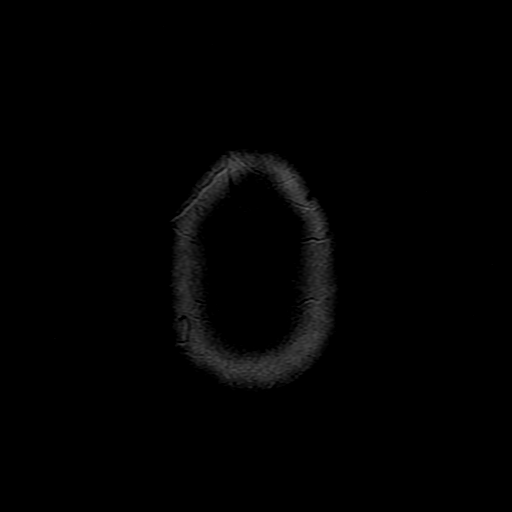

[Series 8: DWI · coronal · 5.0mm · 1.09mm/px · 5 of 62 slices shown (2 of 4)]
[im 1/62]
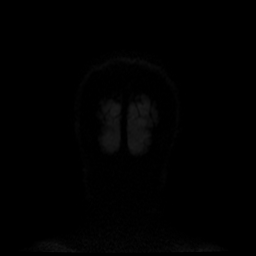
[im 16/62]
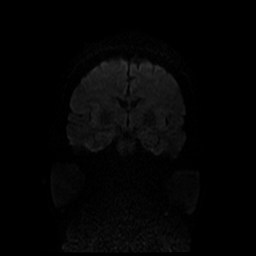
[im 31/62]
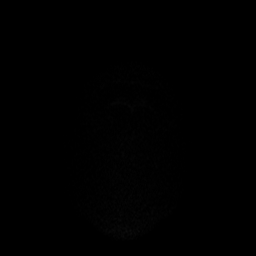
[im 46/62]
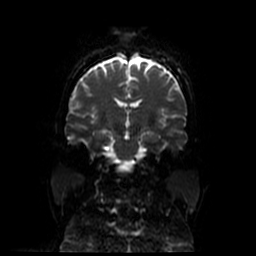
[im 62/62]
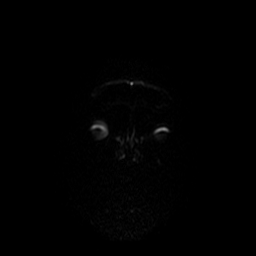

[Series 11: T2 · coronal · 5.0mm · 0.78mm/px · 2 of 25 slices shown (2 of 2)]
[im 1/25]
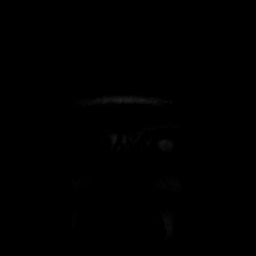
[im 25/25]
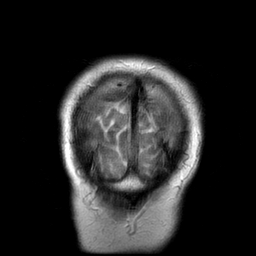

[Series 400: DWI · axial · 5.0mm · 1.09mm/px · z∈[-31,+113]mm · 3 of 30 slices shown (3 of 4)]
[im 1/30]
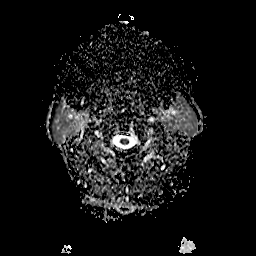
[im 15/30]
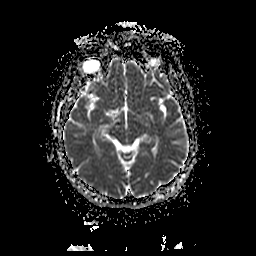
[im 30/30]
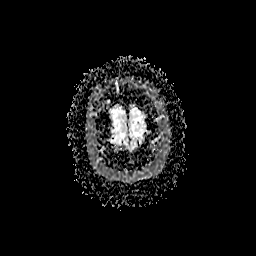

[Series 800: DWI · coronal · 5.0mm · 1.09mm/px · 3 of 31 slices shown (4 of 4)]
[im 1/31]
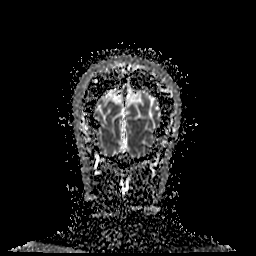
[im 16/31]
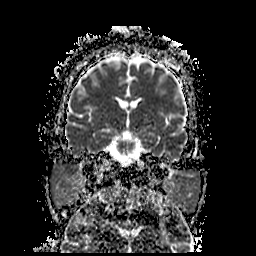
[im 31/31]
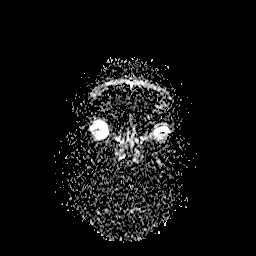

[31 of 48 positions shown; findings below may reference images not displayed]

FINDINGS: MRI HEAD FINDINGS

There is no acute infarct. Ventricles and sulci are normal for age.
There is no evidence of intracranial hemorrhage, mass, midline
shift, or extra-axial fluid collection. No brain parenchymal signal
abnormality is identified.

Orbits are unremarkable. Paranasal sinuses and mastoid air cells are
clear. Major intracranial vascular flow voids are preserved.
Calvarium and scalp soft tissues are unremarkable.

MRA HEAD FINDINGS

Images are mildly degraded by motion.

Visualized distal vertebral arteries are patent with the left being
mildly dominant. PICA origins are patent. SCA origins are patent.
Basilar artery is patent without stenosis. PCAs are unremarkable.

Internal carotid arteries are patent from skullbase to carotid
termini. There is mild irregularity of the proximal left MCA. Left
anterior temporal artery appears attenuated compared to the right.
Right MCA is unremarkable. ACAs are unremarkable. No intracranial
aneurysm is identified.
IMPRESSION: 1. Unremarkable appearance of the brain. No evidence of acute
infarct.
2. Mild proximal left MCA irregularity, suggestive of mild
atherosclerosis. No evidence of major intracranial arterial
occlusion.

## 2016-07-17 ENCOUNTER — Ambulatory Visit (INDEPENDENT_AMBULATORY_CARE_PROVIDER_SITE_OTHER): Payer: BC Managed Care – PPO | Admitting: Physician Assistant

## 2016-07-17 VITALS — BP 126/80 | HR 78 | Temp 98.1°F | Resp 16 | Ht 70.5 in | Wt 222.0 lb

## 2016-07-17 DIAGNOSIS — R51 Headache: Secondary | ICD-10-CM | POA: Diagnosis not present

## 2016-07-17 DIAGNOSIS — R21 Rash and other nonspecific skin eruption: Secondary | ICD-10-CM | POA: Diagnosis not present

## 2016-07-17 DIAGNOSIS — L299 Pruritus, unspecified: Secondary | ICD-10-CM | POA: Diagnosis not present

## 2016-07-17 DIAGNOSIS — L723 Sebaceous cyst: Secondary | ICD-10-CM

## 2016-07-17 NOTE — Patient Instructions (Addendum)
Please pull out the packing in three days and allow the wound to heal.  Neosporin and a bandaid will be fine.     I have faxed what I have to your provider.  Please proceed the nearest [facility]. The nearest [facility] drawing station is located at [place] here in [place]. If you have any questions for them, they can be reached at [phone]   IF you received an x-ray today, you will receive an invoice from [facility]. Please contact [facility] at [phone] with questions or concerns regarding your invoice.   IF you received labwork today, you will receive an invoice from [employer]. Please contact [facility] at [phone] with questions or concerns regarding your invoice.   Our billing staff will not be able to assist you with questions regarding bills from these companies.  You will be contacted with the lab results as soon as they are available. The fastest way to get your results is to activate your My Chart account. Instructions are located on the last page of this paperwork. If you have not heard from [place] regarding the results in 2 weeks, please contact this office.

## 2016-07-17 NOTE — Progress Notes (Signed)
   07/17/2016 1:41 PM   DOB: October 11, 1961 / MRN: [id number]  SUBJECTIVE:  Ivan Allen is a 55 y.o. male presenting for the excision of a sebaceous cyst from the left posterior thorax.  He reports having some mild itching and swelling about the area however denies tenderness. He feels that swelling in increasing.   He is allergic to hydrocodone.   He  has a past medical history of Anxiety; Depression; and Hyperlipidemia.    He  reports that he has been smoking Cigarettes.  He has a 37.00 pack-year smoking history. He has never used smokeless tobacco. He reports that he drinks about 1.2 - 1.8 oz of alcohol per week . He reports that he does not use drugs. He  has no sexual activity history on file. The patient  has a past surgical history that includes Tonsillectomy.  His family history includes Hypertension in his father and mother.  Review of Systems  Constitutional: Negative for malaise/fatigue.  Cardiovascular: Negative for chest pain.  Gastrointestinal: Negative for nausea.  Musculoskeletal: Negative for falls and myalgias.  Skin: Positive for itching and rash.  Neurological: Positive for headaches. Negative for dizziness.    Problem list and medications reviewed and updated by myself where necessary, and exist elsewhere in the encounter.   OBJECTIVE:  BP 126/80   Pulse 78   Temp 98.1 F (36.7 C)   Resp 16   Ht 5' 10.5" (1.791 m)   Wt 222 lb (100.7 kg)   SpO2 96%   BMI 31.40 kg/m   Physical Exam  Constitutional: He is oriented to person, place, and time. He appears well-developed. He does not appear ill.  Eyes: Conjunctivae and EOM are normal. Pupils are equal, round, and reactive to light.  Cardiovascular: Normal rate.   Pulmonary/Chest: Effort normal.  Abdominal: He exhibits no distension.  Musculoskeletal: Normal range of motion.  Neurological: He is alert and oriented to person, place, and time. No cranial nerve deficit. Coordination normal.  Skin: Skin is warm and  dry. He is not diaphoretic.     Psychiatric: He has a normal mood and affect.  Nursing note and vitals reviewed.  Risk and benefits discussed and verbal consent obtained. Anesthetic allergies reviewed. Patient anesthetized using 1:1 mix of 2% lidocaine with epi. A 1 cm incision was made using a number 11 blade and purulent material was expressed.  The was wound packed. The patient tolerated the procedure without difficulty.   A clean dressing was placed and wound care instructions were provided.    No results found for this or any previous visit (from the past 72 hour(s)).  No results found.  ASSESSMENT AND PLAN  Ivan Allen was seen today for mass and other.  Diagnoses and all orders for this visit:  Sebaceous cyst: Drained.  Unfortunately I could not remove the sac. I have packed the wound and advised that he keep the packing in for three days then remove and allow the wound to close.     The patient was advised to call or return to clinic if he does not see an improvement in symptoms, or to seek the care of the closest emergency department if he worsens with the above plan.   Philis Fendt, MHS, PA-C [facility] 07/17/2016 1:41 PM

## 2018-05-29 ENCOUNTER — Encounter: Payer: Self-pay | Admitting: Gastroenterology

## 2018-05-29 ENCOUNTER — Ambulatory Visit: Payer: BC Managed Care – PPO | Admitting: Family Medicine

## 2018-05-29 ENCOUNTER — Encounter: Payer: Self-pay | Admitting: Family Medicine

## 2018-05-29 VITALS — BP 120/82 | HR 85 | Ht 71.0 in | Wt 234.6 lb

## 2018-05-29 DIAGNOSIS — M25512 Pain in left shoulder: Secondary | ICD-10-CM | POA: Diagnosis not present

## 2018-05-29 DIAGNOSIS — F329 Major depressive disorder, single episode, unspecified: Secondary | ICD-10-CM

## 2018-05-29 DIAGNOSIS — Z122 Encounter for screening for malignant neoplasm of respiratory organs: Secondary | ICD-10-CM | POA: Insufficient documentation

## 2018-05-29 DIAGNOSIS — F419 Anxiety disorder, unspecified: Secondary | ICD-10-CM

## 2018-05-29 DIAGNOSIS — G8929 Other chronic pain: Secondary | ICD-10-CM | POA: Diagnosis not present

## 2018-05-29 DIAGNOSIS — F17210 Nicotine dependence, cigarettes, uncomplicated: Secondary | ICD-10-CM

## 2018-05-29 DIAGNOSIS — Z1211 Encounter for screening for malignant neoplasm of colon: Secondary | ICD-10-CM | POA: Diagnosis not present

## 2018-05-29 NOTE — Assessment & Plan Note (Signed)
Stable with current medication, he will continue to follow with his psychiatrist for medication management.

## 2018-05-29 NOTE — Assessment & Plan Note (Signed)
Has had a couple of occasions of BRBPR, referral placed for colonoscopy

## 2018-05-29 NOTE — Assessment & Plan Note (Signed)
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  Referral placed to sports med.

## 2018-05-29 NOTE — Progress Notes (Signed)
Ivan Allen - 57 y.o. male MRN [id number]  Date of birth: March 02, 1961  Subjective Chief Complaint  Patient presents with  . Depression    HPI Ivan Allen is a 57 y.o. male with a history of depression with anxiety, hyperlipdemia and nicotine dependence here today to establish care with new PCP.  He has the following concerns today:  -L shoulder pain: L shoulder pain for about 6 weeks.  Located in anterior shoulder, localizes to [facility] joint.  Denies prior injury.  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  Has been using aleve prn which does help.  Denies radiation of pain into arm, numbness or tingling.   -Depression/anxiety: Long history of depression and anxiety, followed by [facility] for medication management.  Current treatment with lamictal and lexapro, report that this is working fairly well for him.  Denies increased symptoms of depression or anxiety at this time.    Depression screen [facility] 2/9 05/29/2018 09/18/2015  Decreased Interest 1 0  Down, Depressed, Hopeless 2 0  PHQ - 2 Score 3 0  Altered sleeping 3 -  Tired, decreased energy 1 -  Change in appetite 3 -  Feeling bad or failure about yourself  2 -  Trouble concentrating 3 -  Moving slowly or fidgety/restless 1 -  Suicidal thoughts 0 -  PHQ-9 Score 16 -   GAD 7 : Generalized Anxiety Score 05/29/2018  Nervous, Anxious, on Edge 2  Control/stop worrying 0  Worry too much - different things 1  Trouble relaxing 1  Restless 1  Easily annoyed or irritable 3  Afraid - awful might happen 0  Total GAD 7 Score 8    -Rectal bleeding:  Has noticed blood on toilet tissue x2 when wiping over the past few weeks, only after taking aleve.  He does also take a daily 81mg  ASA and fish oil.  He has never had colon cancer screening but is interested in having this.   -Nicotine dependence: Smoking ~1ppd since age 74.  Denies any respiratory symptoms.  Is interested in quitting but not right now.  His  psychiatrist has recommended that he not use chantix for smoking cessation.    ROS:  ROS completed and negative except as noted per HPI Allergies  Allergen Reactions  . Hydrocodone Itching    Past Medical History:  Diagnosis Date  . Anxiety   . Depression   . Hyperlipidemia     Past Surgical History:  Procedure Laterality Date  . TONSILLECTOMY      Social History   Socioeconomic History  . Marital status: Widowed    Spouse name: Not on file  . Number of children: Not on file  . Years of education: Not on file  . Highest education level: Not on file  Occupational History  . Not on file  Social Needs  . Financial resource strain: Not on file  . Food insecurity:    Worry: Not on file    Inability: Not on file  . Transportation needs:    Medical: Not on file    Non-medical: Not on file  Tobacco Use  . Smoking status: Current Every Day Smoker    Packs/day: 1.00    Years: 37.00    Pack years: 37.00    Types: Cigarettes  . Smokeless tobacco: Never Used  Substance and Sexual Activity  . Alcohol use: Yes    Alcohol/week: 1.2 - 1.8 oz    Types: 2 - 3 Standard drinks or equivalent  per week    Comment: beers   . Drug use: No  . Sexual activity: Not on file  Lifestyle  . Physical activity:    Days per week: Not on file    Minutes per session: Not on file  . Stress: Not on file  Relationships  . Social connections:    Talks on phone: Not on file    Gets together: Not on file    Attends religious service: Not on file    Active member of club or organization: Not on file    Attends meetings of clubs or organizations: Not on file    Relationship status: Not on file  Other Topics Concern  . Not on file  Social History Narrative  . Not on file    Family History  Problem Relation Age of Onset  . Hypertension Mother   . Hypertension Father   . Early death Sister   . Heart attack Sister   . Heart disease Sister   . Hyperlipidemia Sister   . Drug abuse Brother    . Alcohol abuse Brother   . Drug abuse Brother   . Drug abuse Brother   . ADD / ADHD Brother   . Colon cancer Neg Hx     Health Maintenance  Topic Date Due  . Hepatitis C Screening  [birth year]  . TETANUS/TDAP  08/30/1979  . COLONOSCOPY  08/29/2010  . INFLUENZA VACCINE  07/25/2018  . HIV Screening  Completed    ----------------------------------------------------------------------------------------------------------------------------------------------------------------------------------------------------------------- Physical Exam BP 120/82   Pulse 85   Ht 5\' 11"  (1.803 m)   Wt 234 lb 9.6 oz (106.4 kg)   BMI 32.72 kg/m   Physical Exam  Constitutional: He is oriented to person, place, and time. He appears well-nourished. No distress.  HENT:  Head: Normocephalic and atraumatic.  Mouth/Throat: Oropharynx is clear and moist.  Neck: Neck supple. No thyromegaly present.  Cardiovascular: Normal rate, regular rhythm and normal heart sounds.  Pulmonary/Chest: Effort normal and breath sounds normal.  Musculoskeletal:  L shoulder with ttp along AC joint, worse with raising arm or cross body movement (+ scarf test).  ROM is good otherwise with normal strength.   Lymphadenopathy:    He has no cervical adenopathy.  Neurological: He is alert and oriented to person, place, and time.  Psychiatric: He has a normal mood and affect. His behavior is normal.    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  Referral placed to sports med.   Anxiety and depression Stable with current medication, he will continue to follow with his  psychiatrist for medication management.   Cigarette nicotine dependence without complication Current daily smoker Recommended that he quit smoking Is interested in quitting but not quite yet Discussed options for treatment including patches, gum, lozenges and rx medications.  Psychiatrist would prefer that he avoid chantix.  Discussed that he may benefit from bupropion.  He will discuss with his psychiatrist Will follow up with him at upcoming visit for CPE in 4-6 weeks.  Time spent counseling: 15 minutes.   Encounter for screening for lung cancer Referral placed for lung cancer screening.

## 2018-05-29 NOTE — Assessment & Plan Note (Signed)
Referral placed for lung cancer screening. 

## 2018-05-29 NOTE — Patient Instructions (Signed)
It was very nice to meet you today Please schedule an appointment in the next few weeks for a physical and fasting labs.   Work on cutting back on smoking

## 2018-05-29 NOTE — Assessment & Plan Note (Signed)
Current daily smoker Recommended that he quit smoking Is interested in quitting but not quite yet Discussed options for treatment including patches, gum, lozenges and rx medications.  Psychiatrist would prefer that he avoid chantix.  Discussed that he may benefit from bupropion.  He will discuss with his psychiatrist Will follow up with him at upcoming visit for CPE in 4-6 weeks.  Time spent counseling: 15 minutes.

## 2018-06-03 ENCOUNTER — Ambulatory Visit (INDEPENDENT_AMBULATORY_CARE_PROVIDER_SITE_OTHER): Payer: BC Managed Care – PPO

## 2018-06-03 ENCOUNTER — Encounter: Payer: Self-pay | Admitting: Family Medicine

## 2018-06-03 ENCOUNTER — Ambulatory Visit: Payer: BC Managed Care – PPO | Admitting: Family Medicine

## 2018-06-03 VITALS — BP 134/78 | HR 78 | Ht 72.0 in | Wt 256.0 lb

## 2018-06-03 DIAGNOSIS — M25512 Pain in left shoulder: Secondary | ICD-10-CM | POA: Diagnosis not present

## 2018-06-03 MED ORDER — DICLOFENAC SODIUM 2 % TD SOLN: 1.0000 "application " | Freq: Two times a day (BID) | TRANSDERMAL | 3 refills | Status: AC

## 2018-06-03 NOTE — Assessment & Plan Note (Signed)
Has different findings on [place]. History suggest more of a impingement problem. BT with possible tenosynovitis secondary to gout. AC with arthritis.  - subacromial injection today  - counseled on HEP  - pennsaid - if no improvement consider imaging, PT or injection in [facility] joint.

## 2018-06-03 NOTE — Progress Notes (Signed)
Ivan Allen - 57 y.o. male MRN [id number]  Date of birth: 28-May-1961  SUBJECTIVE:  Including CC & ROS.  Chief Complaint  Patient presents with  . Left shoulder pain    Ivan Allen is a 57 y.o. male that is presenting left shoulder pain. Pain has been increasing over the past six months. Pain is located in the located in anterior shoulder and no radiation.  Denies prior injury.  Works in Theatre manager at [employer].  Has been using aleve for the pain.   Denies radiation of pain into arm, numbness or tingling.   Pain is worse when he raises arm. Denies any prior surgery. Pain is staying the same. Pain is worse with lying on the affected side.    Review of Systems  Constitutional: Negative for fever.  HENT: Negative for congestion.   Respiratory: Negative for cough.   Cardiovascular: Negative for chest pain.  Gastrointestinal: Negative for abdominal pain.  Musculoskeletal: Negative for gait problem.  Skin: Negative for color change.  Neurological: Negative for weakness.  Hematological: Negative for adenopathy.  Psychiatric/Behavioral: Negative for agitation.    HISTORY: Past Medical, Surgical, Social, and Family History Reviewed & Updated per EMR.   Pertinent Historical Findings include:  Past Medical History:  Diagnosis Date  . Anxiety   . Depression   . Hyperlipidemia     Past Surgical History:  Procedure Laterality Date  . TONSILLECTOMY      Allergies  Allergen Reactions  . Hydrocodone Itching    Family History  Problem Relation Age of Onset  . Hypertension Mother   . Hypertension Father   . Early death Sister   . Heart attack Sister   . Heart disease Sister   . Hyperlipidemia Sister   . Drug abuse Brother   . Alcohol abuse Brother   . Drug abuse Brother   . Drug abuse Brother   . ADD / ADHD Brother   . Colon cancer Neg Hx      Social History   Socioeconomic History  . Marital status: Widowed    Spouse name: Not on file  . Number of children: Not on file    . Years of education: Not on file  . Highest education level: Not on file  Occupational History  . Not on file  Social Needs  . Financial resource strain: Not on file  . Food insecurity:    Worry: Not on file    Inability: Not on file  . Transportation needs:    Medical: Not on file    Non-medical: Not on file  Tobacco Use  . Smoking status: Current Every Day Smoker    Packs/day: 1.00    Years: 37.00    Pack years: 37.00    Types: Cigarettes  . Smokeless tobacco: Never Used  Substance and Sexual Activity  . Alcohol use: Yes    Alcohol/week: 1.2 - 1.8 oz    Types: 2 - 3 Standard drinks or equivalent per week    Comment: beers   . Drug use: No  . Sexual activity: Not on file  Lifestyle  . Physical activity:    Days per week: Not on file    Minutes per session: Not on file  . Stress: Not on file  Relationships  . Social connections:    Talks on phone: Not on file    Gets together: Not on file    Attends religious service: Not on file    Active member of club or organization: Not on  file    Attends meetings of clubs or organizations: Not on file    Relationship status: Not on file  . Intimate partner violence:    Fear of current or ex partner: Not on file    Emotionally abused: Not on file    Physically abused: Not on file    Forced sexual activity: Not on file  Other Topics Concern  . Not on file  Social History Narrative  . Not on file     PHYSICAL EXAM:  VS: BP 134/78 (BP Location: Left Arm, Patient Position: Sitting, Cuff Size: Normal)   Pulse 78   Ht 6' (1.829 m)   Wt 256 lb (116.1 kg)   SpO2 98%   BMI 34.72 kg/m  Physical Exam Gen: NAD, alert, cooperative with exam, well-appearing ENT: normal lips, normal nasal mucosa,  Eye: normal EOM, normal conjunctiva and lids CV:  no edema, +2 pedal pulses   Resp: no accessory muscle use, non-labored,  Skin: no rashes, no areas of induration  Neuro: normal tone, normal sensation to touch Psych:  normal  insight, alert and oriented MSK:  Left Shoulder: Inspection reveals no abnormalities, atrophy or asymmetry. Palpation is normal with no tenderness over AC joint ROM is full in all planes. Rotator cuff strength normal throughout. negative  Hawkin's tests, empty can sign. Speeds  tests normal. Pain with negative Obrien's Pain with cross arm test  Neurovascularly intact   Limited ultrasound: left shoulder:  Hypoechoic change around the proximal BT to suggest an tenosynovitis.  Normal appearing Subscap and infraspinatus  Supraspinatus with tendinopathy type changes and an enlarged buras to suggest bursitis..  Normal posterior GH joint  AD joint with mild effusion and degenerative changes.   Summary: BT tenosynovitis. Subacromial bursitis and AC joint arthritis.   Ultrasound and interpretation by Clearance Coots, MD  Aspiration/Injection Procedure Note Ivan Allen [birth year]  Procedure: Injection Indications: left shoulder pain   Procedure Details Consent: Risks of procedure as well as the alternatives and risks of each were explained to the (patient/caregiver).  Consent for procedure obtained. Time Out: Verified patient identification, verified procedure, site/side was marked, verified correct patient position, special equipment/implants available, medications/allergies/relevent history reviewed, required imaging and test results available.  Performed.  The area was cleaned with iodine and alcohol swabs.    The left subacromial space was injected using 1 cc's of 40 mg Depomedrol and 4 cc's of 1% lidocaine with a 25 1 1/2" needle.  Ultrasound was used. Images were obtained in Long views showing the injection.    A sterile dressing was applied.  Patient did tolerate procedure well.     ASSESSMENT & PLAN:   Acute pain of left shoulder Has different findings on [place]. History suggest more of a impingement problem. BT with possible tenosynovitis secondary to gout. AC with  arthritis.  - subacromial injection today  - counseled on HEP  - pennsaid - if no improvement consider imaging, PT or injection in [facility] joint.

## 2018-06-03 NOTE — Patient Instructions (Signed)
Nice to meet you  Please try the exercises  Please try the pennsaid  Please follow up with me in 3-4 weeks.

## 2018-06-28 ENCOUNTER — Ambulatory Visit (INDEPENDENT_AMBULATORY_CARE_PROVIDER_SITE_OTHER): Payer: BC Managed Care – PPO | Admitting: Family Medicine

## 2018-06-28 ENCOUNTER — Encounter: Payer: Self-pay | Admitting: Family Medicine

## 2018-06-28 VITALS — BP 134/88 | HR 79 | Temp 97.9°F | Ht 72.0 in | Wt 231.4 lb

## 2018-06-28 DIAGNOSIS — Z0000 Encounter for general adult medical examination without abnormal findings: Secondary | ICD-10-CM | POA: Diagnosis not present

## 2018-06-28 DIAGNOSIS — Z125 Encounter for screening for malignant neoplasm of prostate: Secondary | ICD-10-CM | POA: Diagnosis not present

## 2018-06-28 DIAGNOSIS — F17210 Nicotine dependence, cigarettes, uncomplicated: Secondary | ICD-10-CM | POA: Diagnosis not present

## 2018-06-28 DIAGNOSIS — Z1159 Encounter for screening for other viral diseases: Secondary | ICD-10-CM | POA: Diagnosis not present

## 2018-06-28 DIAGNOSIS — Z13220 Encounter for screening for lipoid disorders: Secondary | ICD-10-CM | POA: Diagnosis not present

## 2018-06-28 LAB — COMPREHENSIVE METABOLIC PANEL
ALK PHOS: 67 U/L (ref 39–117)
ALT: 30 U/L (ref 0–53)
AST: 19 U/L (ref 0–37)
Albumin: 4.3 g/dL (ref 3.5–5.2)
BILIRUBIN TOTAL: 0.3 mg/dL (ref 0.2–1.2)
BUN: 22 mg/dL (ref 6–23)
CALCIUM: 8.9 mg/dL (ref 8.4–10.5)
CO2: 26 mEq/L (ref 19–32)
CREATININE: 0.78 mg/dL (ref 0.40–1.50)
Chloride: 106 mEq/L (ref 96–112)
GFR: 108.72 mL/min (ref >60.00)
GLUCOSE: 108 mg/dL — AB (ref 70–99)
Potassium: 4.3 mEq/L (ref 3.5–5.1)
Sodium: 140 mEq/L (ref 135–145)
TOTAL PROTEIN: 6.2 g/dL (ref 6.0–8.3)

## 2018-06-28 LAB — LIPID PANEL
CHOLESTEROL: 225 mg/dL — AB (ref 0–200)
HDL: 35.7 mg/dL — AB (ref >39.00)
NonHDL: 188.82
TRIGLYCERIDES: 214 mg/dL — AB (ref 0.0–149.0)
Total CHOL/HDL Ratio: 6
VLDL: 42.8 mg/dL — ABNORMAL HIGH (ref 0.0–40.0)

## 2018-06-28 LAB — CBC
HCT: 43.7 % (ref 39.0–52.0)
Hemoglobin: 15.4 g/dL (ref 13.0–17.0)
MCHC: 35.2 g/dL (ref 30.0–36.0)
MCV: 93.6 fl (ref 78.0–100.0)
PLATELETS: 227 10*3/uL (ref 150.0–400.0)
RBC: 4.67 Mil/uL (ref 4.22–5.81)
RDW: 13.1 % (ref 11.5–15.5)
WBC: 6.4 10*3/uL (ref 4.0–10.5)

## 2018-06-28 LAB — PSA: PSA: 1.68 ng/mL (ref 0.10–4.00)

## 2018-06-28 LAB — TSH: TSH: 1.71 u[IU]/mL (ref 0.35–4.50)

## 2018-06-28 LAB — LDL CHOLESTEROL, DIRECT: Direct LDL: 141 mg/dL

## 2018-06-28 MED ORDER — NICOTINE 21 MG/24HR TD PT24: 21.0000 mg | MEDICATED_PATCH | Freq: Every day | TRANSDERMAL | 0 refills | Status: DC

## 2018-06-28 MED ORDER — NICOTINE 14 MG/24HR TD PT24: 14.0000 mg | MEDICATED_PATCH | Freq: Every day | TRANSDERMAL | 0 refills | Status: DC

## 2018-06-28 MED ORDER — NICOTINE 7 MG/24HR TD PT24: 7.0000 mg | MEDICATED_PATCH | Freq: Every day | TRANSDERMAL | 0 refills | Status: DC

## 2018-06-28 NOTE — Patient Instructions (Signed)

## 2018-06-28 NOTE — Progress Notes (Signed)
Ivan Allen - 57 y.o. male MRN [id number]  Date of birth: Jan 23, 1961  Subjective Chief Complaint  Patient presents with  . Annual Exam    CPE w/ fasting.    HPI Ivan Allen is a 57 y.o. male here today for annual exam.  He reports he is doing well and has no additional concerns today.  He would like to have fasting labs today.  He unfortunately is still smoking but is interested in quitting.  He thinks he may want to try patches and is currently smoking 1 ppd.  He has an appt. For colonoscopy coming up.  He continues to follow with psychiatry for mgmt of mental health medications.    Review of Systems  Constitutional: Negative for chills, fever, malaise/fatigue and weight loss.  HENT: Negative for congestion, ear pain and sore throat.   Eyes: Negative for blurred vision, double vision and pain.  Respiratory: Negative for cough and shortness of breath.   Cardiovascular: Negative for chest pain and palpitations.  Gastrointestinal: Negative for abdominal pain, blood in stool, constipation, heartburn and nausea.  Genitourinary: Negative for dysuria and urgency.  Musculoskeletal: Negative for joint pain and myalgias.  Neurological: Negative for dizziness and headaches.  Endo/Heme/Allergies: Does not bruise/bleed easily.  Psychiatric/Behavioral: Negative for depression. The patient is not nervous/anxious and does not have insomnia.     Allergies  Allergen Reactions  . Hydrocodone Itching    Past Medical History:  Diagnosis Date  . Anxiety   . Depression   . Hyperlipidemia     Past Surgical History:  Procedure Laterality Date  . TONSILLECTOMY      Social History   Socioeconomic History  . Marital status: Widowed    Spouse name: Not on file  . Number of children: Not on file  . Years of education: Not on file  . Highest education level: Not on file  Occupational History  . Not on file  Social Needs  . Financial resource strain: Not on file  . Food insecurity:   Worry: Not on file    Inability: Not on file  . Transportation needs:    Medical: Not on file    Non-medical: Not on file  Tobacco Use  . Smoking status: Current Every Day Smoker    Packs/day: 1.00    Years: 37.00    Pack years: 37.00    Types: Cigarettes  . Smokeless tobacco: Never Used  Substance and Sexual Activity  . Alcohol use: Yes    Alcohol/week: 1.2 - 1.8 oz    Types: 2 - 3 Standard drinks or equivalent per week    Comment: beers   . Drug use: No  . Sexual activity: Not on file  Lifestyle  . Physical activity:    Days per week: Not on file    Minutes per session: Not on file  . Stress: Not on file  Relationships  . Social connections:    Talks on phone: Not on file    Gets together: Not on file    Attends religious service: Not on file    Active member of club or organization: Not on file    Attends meetings of clubs or organizations: Not on file    Relationship status: Not on file  Other Topics Concern  . Not on file  Social History Narrative  . Not on file    Family History  Problem Relation Age of Onset  . Hypertension Mother   . Hypertension Father   . Early death  Sister   . Heart attack Sister   . Heart disease Sister   . Hyperlipidemia Sister   . Drug abuse Brother   . Alcohol abuse Brother   . Drug abuse Brother   . Drug abuse Brother   . ADD / ADHD Brother   . Colon cancer Neg Hx     Health Maintenance  Topic Date Due  . Hepatitis C Screening  05-30-1961  . TETANUS/TDAP  08/30/1979  . COLONOSCOPY  08/29/2010  . INFLUENZA VACCINE  07/25/2018  . HIV Screening  Completed    ----------------------------------------------------------------------------------------------------------------------------------------------------------------------------------------------------------------- Physical Exam BP 134/88 (BP Location: Left Arm, Patient Position: Sitting, Cuff Size: Normal)   Pulse 79   Temp 97.9 F (36.6 C) (Oral)   Ht 6' (1.829 m)    Wt 231 lb 6.4 oz (105 kg)   SpO2 97%   BMI 31.38 kg/m   Physical Exam  Constitutional: He is oriented to person, place, and time. He appears well-nourished. No distress.  HENT:  Head: Normocephalic and atraumatic.  Mouth/Throat: Oropharynx is clear and moist.  Eyes: No scleral icterus.  Neck: Neck supple. No thyromegaly present.  Cardiovascular: Normal rate, regular rhythm, normal heart sounds and intact distal pulses.  Pulmonary/Chest: Effort normal and breath sounds normal.  Abdominal: Soft. Bowel sounds are normal. He exhibits no distension and no mass. There is no tenderness. There is no guarding.  Genitourinary:  Genitourinary Comments: Declines DRE   Musculoskeletal: Normal range of motion. He exhibits no edema.  Lymphadenopathy:    He has no cervical adenopathy.  Neurological: He is alert and oriented to person, place, and time. No cranial nerve deficit. Coordination normal.  Skin: Skin is warm. No rash noted.  Psychiatric: He has a normal mood and affect. His behavior is normal.    ------------------------------------------------------------------------------------------------------------------------------------------------------------------------------------------------------------------- Assessment and Plan  Well adult exam Well adult Orders Placed This Encounter  Procedures  . Comp Met (CMET)  . CBC  . Lipid Profile  . TSH  . PSA  . Hepatitis C Antibody  Immunizations:  He believes he is up to date on tetanus Screenings:  Has upcoming colonoscopy.  PSA and Hep C screening ordered Anticipatory guidance/Risk factor reduction:  Recommend quitting smoking.  Additional recommendations are Per AVS.     Cigarette nicotine dependence without complication ~5 min spent counseling Wants to quit, willing to try patches.  Rx for patches sent in.  He will discuss bupropion with his psychiatrist as well.

## 2018-06-28 NOTE — Assessment & Plan Note (Signed)
~  5 min spent counseling Wants to quit, willing to try patches.  Rx for patches sent in.  He will discuss bupropion with his psychiatrist as well.

## 2018-06-28 NOTE — Assessment & Plan Note (Signed)
Well adult Orders Placed This Encounter  Procedures  . Comp Met (CMET)  . CBC  . Lipid Profile  . TSH  . PSA  . Hepatitis C Antibody  Immunizations:  He believes he is up to date on tetanus Screenings:  Has upcoming colonoscopy.  PSA and Hep C screening ordered Anticipatory guidance/Risk factor reduction:  Recommend quitting smoking.  Additional recommendations are Per AVS.    

## 2018-06-29 LAB — HEPATITIS C ANTIBODY
Hepatitis C Ab: NONREACTIVE
SIGNAL TO CUT-OFF: 0 (ref <1.00)

## 2018-07-03 NOTE — Progress Notes (Signed)
-  Cholesterol is elevated.  Based on current risk factors he is at increased risk for heart disease.  I would recommend medication to lower this in addition to low fat diet and regular exercise.  I would recommend starting atorvastatin 20mg .  -Other labs look ok including negative Hep C antibody.

## 2018-07-08 ENCOUNTER — Telehealth: Payer: Self-pay | Admitting: Family Medicine

## 2018-07-08 NOTE — Telephone Encounter (Signed)
Elevated risk factor of cardiovascular disease at ~18% over the next 10 years.  It is reasonable for 4-6 month trial of weight loss, low fat diet and exercise to see if this improves his cholesterol.  Have him schedule a f/u with me in the 4-6 month interval and if not seeing significant change I would recommend starting medication to lower his cholesterol and reduce his cardiovascular risk.

## 2018-07-08 NOTE — Telephone Encounter (Signed)
Pt returned call to get lab results with recommendations. Pt voiced understanding of his results and would like to try to change his diet and exercise before starting on atorvastatin.  He would like a call back if his pcp strongly suggests him checking the statin. He wants to know when would be the next lab visit to check his cholesterol.

## 2018-07-08 NOTE — Telephone Encounter (Signed)
fyi

## 2018-07-09 NOTE — Telephone Encounter (Signed)
Left detailed VM for pt in regards to Dr Zigmund Daniel instructions, 4-6 of exercise and low fat diet with a F/U in 4-6 weeks to see if any significant changes have occurred. Will revisit medication possibility after F/U

## 2018-07-25 ENCOUNTER — Telehealth: Payer: Self-pay | Admitting: Family Medicine

## 2018-07-25 NOTE — Telephone Encounter (Unsigned)
Copied from [place] [id number]. Topic: Quick Communication - See Telephone Encounter >> Jul 25, 2018  4:11 PM Neva Seat wrote: Pt is requesting his latest labs be faxed to Deveron Furlong, [place] ...   Fax  [phone] /  Att: Deveron Furlong, [place]

## 2018-07-25 NOTE — Telephone Encounter (Signed)
Labs faxed to number requested

## 2018-08-01 ENCOUNTER — Ambulatory Visit (AMBULATORY_SURGERY_CENTER): Payer: Self-pay

## 2018-08-01 VITALS — Ht 72.0 in | Wt 234.8 lb

## 2018-08-01 DIAGNOSIS — Z1211 Encounter for screening for malignant neoplasm of colon: Secondary | ICD-10-CM

## 2018-08-01 MED ORDER — NA SULFATE-K SULFATE-MG SULF 17.5-3.13-1.6 GM/177ML PO SOLN: 1.0000 | Freq: Once | ORAL | 0 refills | Status: AC

## 2018-08-01 NOTE — Progress Notes (Signed)
Denies allergies to eggs or soy products. Denies complication of anesthesia or sedation. Denies use of weight loss medication. Denies use of O2.   Emmi instructions declined.  

## 2018-08-02 ENCOUNTER — Encounter: Payer: Self-pay | Admitting: Gastroenterology

## 2018-08-15 ENCOUNTER — Encounter: Payer: Self-pay | Admitting: Gastroenterology

## 2018-08-15 ENCOUNTER — Ambulatory Visit (AMBULATORY_SURGERY_CENTER): Payer: BC Managed Care – PPO | Admitting: Gastroenterology

## 2018-08-15 VITALS — BP 101/70 | HR 69 | Temp 99.1°F | Resp 16 | Ht 72.0 in | Wt 234.0 lb

## 2018-08-15 DIAGNOSIS — D120 Benign neoplasm of cecum: Secondary | ICD-10-CM

## 2018-08-15 DIAGNOSIS — K635 Polyp of colon: Secondary | ICD-10-CM | POA: Diagnosis not present

## 2018-08-15 DIAGNOSIS — Z1211 Encounter for screening for malignant neoplasm of colon: Secondary | ICD-10-CM

## 2018-08-15 MED ORDER — SODIUM CHLORIDE 0.9 % IV SOLN: 500.0000 mL | Freq: Once | INTRAVENOUS | Status: DC

## 2018-08-15 NOTE — Progress Notes (Signed)
Called to room to assist during endoscopic procedure.  Patient ID and intended procedure confirmed with present staff. Received instructions for my participation in the procedure from the performing physician.  

## 2018-08-15 NOTE — Progress Notes (Signed)
Pt's states no medical or surgical changes since previsit or office visit. 

## 2018-08-15 NOTE — Patient Instructions (Signed)
HANDOUTS GIVEN FOR POLYPS, DIVERTICULOSIS AND HEMORRHOIDS  YOU HAD AN ENDOSCOPIC PROCEDURE TODAY AT THE [facility]:   Refer to the procedure report that was given to you for any specific questions about what was found during the examination.  If the procedure report does not answer your questions, please call your gastroenterologist to clarify.  If you requested that your care partner not be given the details of your procedure findings, then the procedure report has been included in a sealed envelope for you to review at your convenience later.  YOU SHOULD EXPECT: Some feelings of bloating in the abdomen. Passage of more gas than usual.  Walking can help get rid of the air that was put into your GI tract during the procedure and reduce the bloating. If you had a lower endoscopy (such as a colonoscopy or flexible sigmoidoscopy) you may notice spotting of blood in your stool or on the toilet paper. If you underwent a bowel prep for your procedure, you may not have a normal bowel movement for a few days.  Please Note:  You might notice some irritation and congestion in your nose or some drainage.  This is from the oxygen used during your procedure.  There is no need for concern and it should clear up in a day or so.  SYMPTOMS TO REPORT IMMEDIATELY:   Following lower endoscopy (colonoscopy or flexible sigmoidoscopy):  Excessive amounts of blood in the stool  Significant tenderness or worsening of abdominal pains  Swelling of the abdomen that is new, acute  Fever of 100F or higher  For urgent or emergent issues, a gastroenterologist can be reached at any hour by calling [phone].   DIET:  We do recommend a small meal at first, but then you may proceed to your regular diet.  Drink plenty of fluids but you should avoid alcoholic beverages for 24 hours.  ACTIVITY:  You should plan to take it easy for the rest of today and you should NOT DRIVE or use heavy machinery until tomorrow  (because of the sedation medicines used during the test).    FOLLOW UP: Our staff will call the number listed on your records the next business day following your procedure to check on you and address any questions or concerns that you may have regarding the information given to you following your procedure. If we do not reach you, we will leave a message.  However, if you are feeling well and you are not experiencing any problems, there is no need to return our call.  We will assume that you have returned to your regular daily activities without incident.  If any biopsies were taken you will be contacted by phone or by letter within the next 1-3 weeks.  Please call us at [phone] if you have not heard about the biopsies in 3 weeks.    SIGNATURES/CONFIDENTIALITY: You and/or your care partner have signed paperwork which will be entered into your electronic medical record.  These signatures attest to the fact that that the information above on your After Visit Summary has been reviewed and is understood.  Full responsibility of the confidentiality of this discharge information lies with you and/or your care-partner. 

## 2018-08-15 NOTE — Progress Notes (Signed)
To PACU, VSS. Report to [email or web address] 

## 2018-08-15 NOTE — Op Note (Signed)
[facility] Patient Name: Ivan Allen Procedure Date: 08/15/2018 11:45 AM MRN: [id number] Endoscopist: Remo Lipps P. Havery Moros , MD Age: 57 Referring MD:  Date of Birth: May 28, 1961 Gender: Male Account #: [id number] Procedure:                Colonoscopy Indications:              Screening for colorectal malignant neoplasm, This                            is the patient's first colonoscopy Medicines:                Monitored Anesthesia Care Procedure:                Pre-Anesthesia Assessment:                           - Prior to the procedure, a History and Physical                            was performed, and patient medications and                            allergies were reviewed. The patient's tolerance of                            previous anesthesia was also reviewed. The risks                            and benefits of the procedure and the sedation                            options and risks were discussed with the patient.                            All questions were answered, and informed consent                            was obtained. Prior Anticoagulants: The patient has                            taken no previous anticoagulant or antiplatelet                            agents. ASA Grade Assessment: II - A patient with                            mild systemic disease. After reviewing the risks                            and benefits, the patient was deemed in                            satisfactory condition to undergo the procedure.  After obtaining informed consent, the colonoscope                            was passed under direct vision. Throughout the                            procedure, the patient's blood pressure, pulse, and                            oxygen saturations were monitored continuously. The                            Colonoscope was introduced through the anus and                            advanced to the the  cecum, identified by                            appendiceal orifice and ileocecal valve. The                            colonoscopy was performed without difficulty. The                            patient tolerated the procedure well. The quality                            of the bowel preparation was good. The ileocecal                            valve, appendiceal orifice, and rectum were                            photographed. Scope In: 11:55:53 AM Scope Out: 12:08:30 PM Scope Withdrawal Time: 0 hours 10 minutes 27 seconds  Total Procedure Duration: 0 hours 12 minutes 37 seconds  Findings:                 The perianal and digital rectal examinations were                            normal.                           A diminutive polyp was found in the cecum. The                            polyp was sessile. The polyp was removed with a                            cold biopsy forceps. Resection and retrieval were                            complete.  A few small-mouthed diverticula were found in the                            sigmoid colon.                           Internal hemorrhoids were found during retroflexion.                           The exam was otherwise without abnormality. Complications:            No immediate complications. Estimated blood loss:                            Minimal. Estimated Blood Loss:     Estimated blood loss was minimal. Impression:               - One diminutive polyp in the cecum, removed with a                            cold biopsy forceps. Resected and retrieved.                           - Diverticulosis in the sigmoid colon.                           - Internal hemorrhoids.                           - The examination was otherwise normal. Recommendation:           - Patient has a contact number available for                            emergencies. The signs and symptoms of potential                            delayed  complications were discussed with the                            patient. Return to normal activities tomorrow.                            Written discharge instructions were provided to the                            patient.                           - Resume previous diet.                           - Continue present medications.                           - Await pathology results. Remo Lipps P. Dhamar Gregory, MD 08/15/2018 12:11:46 PM This report has been signed electronically.

## 2018-08-16 ENCOUNTER — Telehealth: Payer: Self-pay

## 2018-08-16 NOTE — Telephone Encounter (Signed)
  Follow up Call-  Call back number 08/15/2018  Post procedure Call Back phone  # [phone]  Permission to leave phone message Yes  Some recent data might be hidden     Patient questions:  Do you have a fever, pain , or abdominal swelling? No. Pain Score  0 *  Have you tolerated food without any problems? Yes.    Have you been able to return to your normal activities? Yes.    Do you have any questions about your discharge instructions: Diet   No. Medications  No. Follow up visit  No.  Do you have questions or concerns about your Care? No.  Actions: * If pain score is 4 or above: No action needed, pain <4.

## 2018-08-21 ENCOUNTER — Encounter: Payer: Self-pay | Admitting: Gastroenterology

## 2025-01-05 ENCOUNTER — Ambulatory Visit (HOSPITAL_COMMUNITY): Admission: RE | Admit: 2025-01-05

## 2025-01-05 ENCOUNTER — Other Ambulatory Visit (HOSPITAL_COMMUNITY): Payer: Self-pay

## 2025-01-05 ENCOUNTER — Encounter (HOSPITAL_COMMUNITY): Payer: Self-pay

## 2025-01-05 ENCOUNTER — Ambulatory Visit (HOSPITAL_COMMUNITY)
Admission: RE | Admit: 2025-01-05 | Discharge: 2025-01-05 | Disposition: A | Discharge status: Home / Self Care | Source: Ambulatory Visit | Attending: Surgery | Admitting: Surgery

## 2025-01-05 DIAGNOSIS — M79604 Pain in right leg: Secondary | ICD-10-CM

## 2025-01-05 LAB — VAS US ABI WITH/WO TBI
Left ABI: 1.16
Right ABI: 1.16
